# Patient Record
Sex: Female | Born: 1985 | Race: White | Hispanic: No | Marital: Married | Smoking: Former smoker
Health system: Southern US, Community
[De-identification: ages and names within clinical notes are randomized; demographics above are authoritative.]

## PROBLEM LIST (undated history)

## (undated) DIAGNOSIS — M549 Dorsalgia, unspecified: Secondary | ICD-10-CM

## (undated) DIAGNOSIS — D649 Anemia, unspecified: Secondary | ICD-10-CM

## (undated) DIAGNOSIS — G8929 Other chronic pain: Secondary | ICD-10-CM

## (undated) DIAGNOSIS — N390 Urinary tract infection, site not specified: Secondary | ICD-10-CM

## (undated) DIAGNOSIS — R51 Headache: Secondary | ICD-10-CM

## (undated) DIAGNOSIS — L409 Psoriasis, unspecified: Secondary | ICD-10-CM

## (undated) DIAGNOSIS — K219 Gastro-esophageal reflux disease without esophagitis: Secondary | ICD-10-CM

## (undated) HISTORY — PX: COSMETIC SURGERY: SHX468

## (undated) HISTORY — PX: CHOLECYSTECTOMY: SHX55

---

## 2012-12-01 ENCOUNTER — Other Ambulatory Visit: Payer: Self-pay | Admitting: Neurosurgery

## 2012-12-03 ENCOUNTER — Encounter (HOSPITAL_COMMUNITY): Payer: Self-pay

## 2012-12-03 ENCOUNTER — Encounter (HOSPITAL_COMMUNITY): Payer: Self-pay | Admitting: Pharmacist

## 2012-12-03 MED ORDER — CEFAZOLIN SODIUM 10 G IJ SOLR
3.0000 g | INTRAMUSCULAR | Status: AC
  Administered 2012-12-04: 3 g via INTRAVENOUS
  Filled 2012-12-03: qty 3000

## 2012-12-04 ENCOUNTER — Encounter (HOSPITAL_COMMUNITY)
Admission: RE | Disposition: A | Discharge status: Home / Self Care | Payer: Self-pay | Source: Ambulatory Visit | Attending: Neurosurgery

## 2012-12-04 ENCOUNTER — Encounter (HOSPITAL_COMMUNITY): Payer: Self-pay | Admitting: Anesthesiology

## 2012-12-04 ENCOUNTER — Encounter (HOSPITAL_COMMUNITY): Payer: Self-pay | Admitting: *Deleted

## 2012-12-04 ENCOUNTER — Ambulatory Visit (HOSPITAL_COMMUNITY): Payer: BC Managed Care – PPO

## 2012-12-04 ENCOUNTER — Ambulatory Visit (HOSPITAL_COMMUNITY): Payer: BC Managed Care – PPO | Admitting: Anesthesiology

## 2012-12-04 ENCOUNTER — Ambulatory Visit (HOSPITAL_COMMUNITY)
Admission: RE | Admit: 2012-12-04 | Discharge: 2012-12-05 | Disposition: A | Discharge status: Home / Self Care | Payer: BC Managed Care – PPO | Source: Ambulatory Visit | Attending: Neurosurgery | Admitting: Neurosurgery

## 2012-12-04 DIAGNOSIS — M5126 Other intervertebral disc displacement, lumbar region: Secondary | ICD-10-CM | POA: Insufficient documentation

## 2012-12-04 HISTORY — DX: Anemia, unspecified: D64.9

## 2012-12-04 HISTORY — DX: Dorsalgia, unspecified: M54.9

## 2012-12-04 HISTORY — DX: Other chronic pain: G89.29

## 2012-12-04 HISTORY — PX: LUMBAR LAMINECTOMY/DECOMPRESSION MICRODISCECTOMY: SHX5026

## 2012-12-04 HISTORY — DX: Psoriasis, unspecified: L40.9

## 2012-12-04 HISTORY — DX: Gastro-esophageal reflux disease without esophagitis: K21.9

## 2012-12-04 HISTORY — DX: Urinary tract infection, site not specified: N39.0

## 2012-12-04 HISTORY — DX: Headache: R51

## 2012-12-04 LAB — HCG, SERUM, QUALITATIVE: Preg, Serum: NEGATIVE

## 2012-12-04 LAB — CBC
Hemoglobin: 13.1 g/dL (ref 12.0–15.0)
Platelets: 229 10*3/uL (ref 150–400)
RBC: 4.95 MIL/uL (ref 3.87–5.11)
WBC: 7 10*3/uL (ref 4.0–10.5)

## 2012-12-04 LAB — SURGICAL PCR SCREEN: MRSA, PCR: NEGATIVE

## 2012-12-04 SURGERY — LUMBAR LAMINECTOMY/DECOMPRESSION MICRODISCECTOMY 1 LEVEL
Anesthesia: General | Site: Spine Lumbar | Laterality: Bilateral | Wound class: Clean

## 2012-12-04 MED ORDER — MUPIROCIN 2 % EX OINT
TOPICAL_OINTMENT | Freq: Two times a day (BID) | CUTANEOUS | Status: DC
  Administered 2012-12-04: 22:00:00 via NASAL

## 2012-12-04 MED ORDER — LACTATED RINGERS IV SOLN
INTRAVENOUS | Status: DC | PRN
  Administered 2012-12-04 (×2): via INTRAVENOUS

## 2012-12-04 MED ORDER — SODIUM CHLORIDE 0.9 % IJ SOLN
3.0000 mL | Freq: Two times a day (BID) | INTRAMUSCULAR | Status: DC
  Administered 2012-12-04: 3 mL via INTRAVENOUS

## 2012-12-04 MED ORDER — NEOSTIGMINE METHYLSULFATE 1 MG/ML IJ SOLN
INTRAMUSCULAR | Status: DC | PRN
  Administered 2012-12-04: 3 mg via INTRAVENOUS

## 2012-12-04 MED ORDER — PHENOL 1.4 % MT LIQD: 1.0000 | OROMUCOSAL | Status: DC | PRN

## 2012-12-04 MED ORDER — CEFAZOLIN SODIUM 1-5 GM-% IV SOLN
INTRAVENOUS | Status: AC
  Filled 2012-12-04: qty 50

## 2012-12-04 MED ORDER — POTASSIUM CHLORIDE IN NACL 20-0.9 MEQ/L-% IV SOLN
INTRAVENOUS | Status: DC
  Filled 2012-12-04 (×3): qty 1000

## 2012-12-04 MED ORDER — LIDOCAINE-EPINEPHRINE 0.5 %-1:200000 IJ SOLN
INTRAMUSCULAR | Status: DC | PRN
  Administered 2012-12-04: 10 mL

## 2012-12-04 MED ORDER — OXYCODONE HCL 5 MG/5ML PO SOLN: 5.0000 mg | Freq: Once | ORAL | Status: DC | PRN

## 2012-12-04 MED ORDER — MEPERIDINE HCL 25 MG/ML IJ SOLN: 6.2500 mg | INTRAMUSCULAR | Status: DC | PRN

## 2012-12-04 MED ORDER — THROMBIN 5000 UNITS EX KIT
PACK | CUTANEOUS | Status: DC | PRN
  Administered 2012-12-04 (×2): 5000 [IU] via TOPICAL

## 2012-12-04 MED ORDER — 0.9 % SODIUM CHLORIDE (POUR BTL) OPTIME
TOPICAL | Status: DC | PRN
  Administered 2012-12-04: 1000 mL

## 2012-12-04 MED ORDER — HYDROMORPHONE HCL PF 1 MG/ML IJ SOLN
0.2500 mg | INTRAMUSCULAR | Status: DC | PRN
  Administered 2012-12-04 (×2): 0.5 mg via INTRAVENOUS

## 2012-12-04 MED ORDER — MIDAZOLAM HCL 5 MG/5ML IJ SOLN
INTRAMUSCULAR | Status: DC | PRN
  Administered 2012-12-04: 2 mg via INTRAVENOUS

## 2012-12-04 MED ORDER — CEFAZOLIN SODIUM-DEXTROSE 2-3 GM-% IV SOLR
INTRAVENOUS | Status: AC
  Filled 2012-12-04: qty 50

## 2012-12-04 MED ORDER — DEXAMETHASONE SODIUM PHOSPHATE 10 MG/ML IJ SOLN
INTRAMUSCULAR | Status: DC | PRN
  Administered 2012-12-04: 8 mg via INTRAVENOUS

## 2012-12-04 MED ORDER — FENTANYL CITRATE 0.05 MG/ML IJ SOLN
INTRAMUSCULAR | Status: DC | PRN
  Administered 2012-12-04: 100 ug via INTRAVENOUS
  Administered 2012-12-04 (×4): 50 ug via INTRAVENOUS

## 2012-12-04 MED ORDER — ZOLPIDEM TARTRATE 5 MG PO TABS: 5.0000 mg | ORAL_TABLET | Freq: Every evening | ORAL | Status: DC | PRN

## 2012-12-04 MED ORDER — ONDANSETRON HCL 4 MG/2ML IJ SOLN
INTRAMUSCULAR | Status: AC
  Filled 2012-12-04: qty 2

## 2012-12-04 MED ORDER — MORPHINE SULFATE 2 MG/ML IJ SOLN
1.0000 mg | INTRAMUSCULAR | Status: DC | PRN
  Administered 2012-12-04: 2 mg via INTRAVENOUS
  Filled 2012-12-04: qty 1

## 2012-12-04 MED ORDER — ONDANSETRON HCL 4 MG/2ML IJ SOLN
INTRAMUSCULAR | Status: DC | PRN
  Administered 2012-12-04: 4 mg via INTRAVENOUS

## 2012-12-04 MED ORDER — OXYCODONE HCL 5 MG PO TABS: 5.0000 mg | ORAL_TABLET | ORAL | Status: DC | PRN

## 2012-12-04 MED ORDER — MUPIROCIN 2 % EX OINT
TOPICAL_OINTMENT | Freq: Once | CUTANEOUS | Status: AC
  Administered 2012-12-04: 07:00:00 via NASAL
  Filled 2012-12-04: qty 22

## 2012-12-04 MED ORDER — GLYCOPYRROLATE 0.2 MG/ML IJ SOLN
INTRAMUSCULAR | Status: DC | PRN
  Administered 2012-12-04: 0.4 mg via INTRAVENOUS

## 2012-12-04 MED ORDER — KETOROLAC TROMETHAMINE 30 MG/ML IJ SOLN
30.0000 mg | Freq: Four times a day (QID) | INTRAMUSCULAR | Status: DC
  Administered 2012-12-04 – 2012-12-05 (×3): 30 mg via INTRAVENOUS
  Filled 2012-12-04 (×7): qty 1

## 2012-12-04 MED ORDER — SENNA 8.6 MG PO TABS
1.0000 | ORAL_TABLET | Freq: Two times a day (BID) | ORAL | Status: DC
  Administered 2012-12-04: 8.6 mg via ORAL
  Filled 2012-12-04 (×3): qty 1

## 2012-12-04 MED ORDER — SODIUM CHLORIDE 0.9 % IJ SOLN: 3.0000 mL | INTRAMUSCULAR | Status: DC | PRN

## 2012-12-04 MED ORDER — ACETAMINOPHEN 10 MG/ML IV SOLN
1000.0000 mg | Freq: Four times a day (QID) | INTRAVENOUS | Status: DC
  Administered 2012-12-04 – 2012-12-05 (×3): 1000 mg via INTRAVENOUS
  Filled 2012-12-04 (×3): qty 100

## 2012-12-04 MED ORDER — ONDANSETRON HCL 4 MG/2ML IJ SOLN: 4.0000 mg | INTRAMUSCULAR | Status: DC | PRN

## 2012-12-04 MED ORDER — PROPOFOL 10 MG/ML IV BOLUS
INTRAVENOUS | Status: DC | PRN
  Administered 2012-12-04: 120 mg via INTRAVENOUS

## 2012-12-04 MED ORDER — ROCURONIUM BROMIDE 100 MG/10ML IV SOLN
INTRAVENOUS | Status: DC | PRN
  Administered 2012-12-04: 50 mg via INTRAVENOUS
  Administered 2012-12-04: 15 mg via INTRAVENOUS

## 2012-12-04 MED ORDER — LIDOCAINE HCL (CARDIAC) 20 MG/ML IV SOLN
INTRAVENOUS | Status: DC | PRN
  Administered 2012-12-04: 100 mg via INTRAVENOUS

## 2012-12-04 MED ORDER — HEMOSTATIC AGENTS (NO CHARGE) OPTIME
TOPICAL | Status: DC | PRN
  Administered 2012-12-04: 1 via TOPICAL

## 2012-12-04 MED ORDER — MUPIROCIN 2 % EX OINT
TOPICAL_OINTMENT | CUTANEOUS | Status: AC
  Filled 2012-12-04: qty 22

## 2012-12-04 MED ORDER — LORATADINE 10 MG PO TABS
10.0000 mg | ORAL_TABLET | Freq: Every day | ORAL | Status: DC
  Administered 2012-12-04: 10 mg via ORAL
  Filled 2012-12-04 (×2): qty 1

## 2012-12-04 MED ORDER — HYDROMORPHONE HCL PF 1 MG/ML IJ SOLN
INTRAMUSCULAR | Status: AC
  Filled 2012-12-04: qty 1

## 2012-12-04 MED ORDER — HYDROCODONE-ACETAMINOPHEN 5-325 MG PO TABS: 1.0000 | ORAL_TABLET | ORAL | Status: DC | PRN

## 2012-12-04 MED ORDER — OXYCODONE HCL 5 MG PO TABS: 5.0000 mg | ORAL_TABLET | Freq: Once | ORAL | Status: DC | PRN

## 2012-12-04 MED ORDER — SENNOSIDES-DOCUSATE SODIUM 8.6-50 MG PO TABS
1.0000 | ORAL_TABLET | Freq: Every evening | ORAL | Status: DC | PRN
  Administered 2012-12-04: 1 via ORAL
  Filled 2012-12-04: qty 1

## 2012-12-04 MED ORDER — ONDANSETRON HCL 4 MG/2ML IJ SOLN
4.0000 mg | Freq: Once | INTRAMUSCULAR | Status: AC | PRN
  Administered 2012-12-04: 4 mg via INTRAVENOUS

## 2012-12-04 MED ORDER — MENTHOL 3 MG MT LOZG: 1.0000 | LOZENGE | OROMUCOSAL | Status: DC | PRN

## 2012-12-04 MED ORDER — CELECOXIB 200 MG PO CAPS: 200.0000 mg | ORAL_CAPSULE | Freq: Every evening | ORAL | Status: DC

## 2012-12-04 SURGICAL SUPPLY — 54 items
ADH SKN CLS APL DERMABOND .7 (GAUZE/BANDAGES/DRESSINGS)
BAG DECANTER FOR FLEXI CONT (MISCELLANEOUS) IMPLANT
BENZOIN TINCTURE PRP APPL 2/3 (GAUZE/BANDAGES/DRESSINGS) IMPLANT
BLADE SURG ROTATE 9660 (MISCELLANEOUS) IMPLANT
BUR MATCHSTICK NEURO 3.0 LAGG (BURR) ×2 IMPLANT
CANISTER SUCTION 2500CC (MISCELLANEOUS) ×2 IMPLANT
CLOTH BEACON ORANGE TIMEOUT ST (SAFETY) ×2 IMPLANT
CONT SPEC 4OZ CLIKSEAL STRL BL (MISCELLANEOUS) ×2 IMPLANT
DECANTER SPIKE VIAL GLASS SM (MISCELLANEOUS) ×2 IMPLANT
DERMABOND ADHESIVE PROPEN (GAUZE/BANDAGES/DRESSINGS) ×1
DERMABOND ADVANCED (GAUZE/BANDAGES/DRESSINGS)
DERMABOND ADVANCED .7 DNX12 (GAUZE/BANDAGES/DRESSINGS) IMPLANT
DERMABOND ADVANCED .7 DNX6 (GAUZE/BANDAGES/DRESSINGS) ×1 IMPLANT
DRAPE LAPAROTOMY 100X72X124 (DRAPES) ×2 IMPLANT
DRAPE MICROSCOPE LEICA (MISCELLANEOUS) ×2 IMPLANT
DRAPE POUCH INSTRU U-SHP 10X18 (DRAPES) ×2 IMPLANT
DRAPE SURG 17X23 STRL (DRAPES) ×2 IMPLANT
DURAPREP 26ML APPLICATOR (WOUND CARE) IMPLANT
ELECT BLADE 6.5 EXT (BLADE) ×2 IMPLANT
ELECT REM PT RETURN 9FT ADLT (ELECTROSURGICAL) ×2
ELECTRODE REM PT RTRN 9FT ADLT (ELECTROSURGICAL) ×1 IMPLANT
GAUZE SPONGE 4X4 16PLY XRAY LF (GAUZE/BANDAGES/DRESSINGS) IMPLANT
GLOVE BIOGEL PI IND STRL 8.5 (GLOVE) ×2 IMPLANT
GLOVE BIOGEL PI INDICATOR 8.5 (GLOVE) ×2
GLOVE ECLIPSE 6.5 STRL STRAW (GLOVE) ×2 IMPLANT
GLOVE ECLIPSE 7.5 STRL STRAW (GLOVE) ×2 IMPLANT
GLOVE EXAM NITRILE LRG STRL (GLOVE) IMPLANT
GLOVE EXAM NITRILE MD LF STRL (GLOVE) IMPLANT
GLOVE EXAM NITRILE XL STR (GLOVE) IMPLANT
GLOVE EXAM NITRILE XS STR PU (GLOVE) IMPLANT
GLOVE SURG SS PI 8.0 STRL IVOR (GLOVE) ×6 IMPLANT
GOWN BRE IMP SLV AUR LG STRL (GOWN DISPOSABLE) ×4 IMPLANT
GOWN BRE IMP SLV AUR XL STRL (GOWN DISPOSABLE) IMPLANT
GOWN STRL REIN 2XL LVL4 (GOWN DISPOSABLE) ×4 IMPLANT
KIT BASIN OR (CUSTOM PROCEDURE TRAY) ×2 IMPLANT
KIT ROOM TURNOVER OR (KITS) ×2 IMPLANT
NEEDLE HYPO 25X1 1.5 SAFETY (NEEDLE) ×2 IMPLANT
NEEDLE SPNL 18GX3.5 QUINCKE PK (NEEDLE) ×2 IMPLANT
NS IRRIG 1000ML POUR BTL (IV SOLUTION) ×2 IMPLANT
PACK LAMINECTOMY NEURO (CUSTOM PROCEDURE TRAY) ×2 IMPLANT
PAD ARMBOARD 7.5X6 YLW CONV (MISCELLANEOUS) ×10 IMPLANT
RUBBERBAND STERILE (MISCELLANEOUS) ×4 IMPLANT
SPONGE GAUZE 4X4 12PLY (GAUZE/BANDAGES/DRESSINGS) IMPLANT
SPONGE LAP 4X18 X RAY DECT (DISPOSABLE) IMPLANT
SPONGE SURGIFOAM ABS GEL SZ50 (HEMOSTASIS) ×2 IMPLANT
STRIP CLOSURE SKIN 1/2X4 (GAUZE/BANDAGES/DRESSINGS) IMPLANT
SUT VIC AB 0 CT1 18XCR BRD8 (SUTURE) ×1 IMPLANT
SUT VIC AB 0 CT1 8-18 (SUTURE) ×1
SUT VIC AB 2-0 CT1 18 (SUTURE) ×2 IMPLANT
SUT VIC AB 3-0 SH 8-18 (SUTURE) ×2 IMPLANT
SYR 20ML ECCENTRIC (SYRINGE) ×2 IMPLANT
TOWEL OR 17X24 6PK STRL BLUE (TOWEL DISPOSABLE) ×2 IMPLANT
TOWEL OR 17X26 10 PK STRL BLUE (TOWEL DISPOSABLE) ×2 IMPLANT
WATER STERILE IRR 1000ML POUR (IV SOLUTION) ×2 IMPLANT

## 2012-12-04 NOTE — Anesthesia Preprocedure Evaluation (Addendum)
Anesthesia Evaluation  Patient identified by MRN, date of birth, ID band Patient awake    Reviewed: Allergy & Precautions, H&P , NPO status , Patient's Chart, lab work & pertinent test results, reviewed documented beta blocker date and time   History of Anesthesia Complications Negative for: history of anesthetic complications  Airway Mallampati: II TM Distance: >3 FB Neck ROM: Full    Dental  (+) Poor Dentition and Dental Advisory Given   Pulmonary Current Smoker,          Cardiovascular negative cardio ROS      Neuro/Psych  Headaches,    GI/Hepatic Neg liver ROS, GERD-  Medicated and Controlled,  Endo/Other  negative endocrine ROS  Renal/GU negative Renal ROS     Musculoskeletal negative musculoskeletal ROS (+)   Abdominal   Peds  Hematology negative hematology ROS (+)   Anesthesia Other Findings Lower teeth broken  Reproductive/Obstetrics negative OB ROS                          Anesthesia Physical Anesthesia Plan  ASA: III  Anesthesia Plan: General   Post-op Pain Management:    Induction: Intravenous  Airway Management Planned: Oral ETT  Additional Equipment:   Intra-op Plan:   Post-operative Plan: Extubation in OR  Informed Consent: I have reviewed the patients History and Physical, chart, labs and discussed the procedure including the risks, benefits and alternatives for the proposed anesthesia with the patient or authorized representative who has indicated his/her understanding and acceptance.     Plan Discussed with: CRNA and Surgeon  Anesthesia Plan Comments:         Anesthesia Quick Evaluation

## 2012-12-04 NOTE — Op Note (Signed)
12/04/2012  12:44 PM  PATIENT:  Colleen Navarro  27 y.o. 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 was brought to the operating room intubated and placed under a general anesthetic. She was positioned on a Wilson frame with all pressure points padded. Her back was prepped and draped in a sterile manner. I infiltrated 10cc into the planned incision. I opened the skin with a 10 blade and took the incision down to the thoracolumbar fascia. I exposed the lamina of L4 and L5 bilaterally. I confirmed my location with intraoperative xray. I performed a semilaminectomy of L4 and L5. I exposed the thecal sac. The microscope was brought into the operative field. With microdissection Dr. Phoebe Perch and I found very large fragments of disc material both rostral and caudal to the disc space. Endplate, was also herniated out of the disc space.  I did have to open the disc space. Using microdissection we again found large degenerated fragments of disc in the space. I did not open the disc space on the left. We were able to decompress the canal, and the L5 roots bilaterally. We removed all the loose fragements that we could appreciate. I irrigated. We then closed the wound in layers. We approximated the thoracolumbar fascia, subcutaneous, and subcuticular  Tissue with vicryl sutures. I used dermabond for a sterile dressing.   PLAN OF CARE: Admit for  overnight observation  PATIENT DISPOSITION:  PACU - hemodynamically stable.   Delay start of Pharmacological VTE agent (>24hrs) due to surgical blood loss or risk of bleeding:  yes

## 2012-12-04 NOTE — H&P (Signed)
  BP 114/83  Pulse 103  Temp(Src) 98.1 F (36.7 C) (Oral)  Resp 20  Ht 5\' 6"  (1.676 m)  Wt 123.832 kg (273 lb)  BMI 44.08 kg/m2  SpO2 100%  LMP 10/04/2012 Colleen Navarro is a 27 y.o. woman with lower extremity pain which has gotten worse since this fall. Repeat mri shows an enlarged disc herniation at L4/5 Allergies  Allergen Reactions  . Iodine Hives  . Neosporin (Neomycin-Bacitracin Zn-Polymyx) Rash    No trouble breating   Past Medical History  Diagnosis Date  . Urinary tract infection     hx of  . GERD (gastroesophageal reflux disease)   . Headache     occasional and hx of migraines  . Anemia     hx of  . Chronic back pain     low back  . Psoriasis    Past Surgical History  Procedure Laterality Date  . Cosmetic surgery      right cheek (face)  . Cholecystectomy     Prior to Admission medications   Medication Sig Start Date End Date Taking? Authorizing Provider  celecoxib (CELEBREX) 200 MG capsule Take 200 mg by mouth every evening.   Yes Historical Provider, MD  cetirizine (ZYRTEC) 10 MG tablet Take 10 mg by mouth every evening.   Yes Historical Provider, MD  cyclobenzaprine (FLEXERIL) 10 MG tablet Take 10 mg by mouth at bedtime.   Yes Historical Provider, MD   History reviewed. No pertinent family history. History   Social History  . Marital Status: Single    Spouse Name: N/A    Number of Children: N/A  . Years of Education: N/A   Occupational History  . Not on file.   Social History Main Topics  . Smoking status: Current Every Day Smoker -- 0.50 packs/day for 10 years    Types: Cigarettes  . Smokeless tobacco: Not on file  . Alcohol Use: Yes     Comment: "social"  . Drug Use: No  . Sexually Active: Not on file   Other Topics Concern  . Not on file   Social History Narrative  . No narrative on file   BP 114/83  Pulse 103  Temp(Src) 98.1 F (36.7 C) (Oral)  Resp 20  Ht 5\' 6"  (1.676 m)  Wt 123.832 kg (273 lb)  BMI 44.08 kg/m2  SpO2 100%   LMP 10/04/2012 Colleen Navarro has decided to undergo a lumbar discetomy/decompression for a herniated disc at levels L4/5. Risks and benefits including but not limited to bleeding, infection, paralysis, weakness in one or both extremities, bowel and/or bladder dysfunction, need for further surgery, no relief of pain. Colleen Navarro understands and wishes to proceed.  Alert and oriented x 4, speech is clear and fluent Symmetric facies, tongue and uvula midline 5/5 strength in upper extremities Mild weakness in lower extremities Reflexes intact throughout

## 2012-12-04 NOTE — Transfer of Care (Signed)
Immediate Anesthesia Transfer of Care Note  Patient: Colleen Navarro  Procedure(s) Performed: Procedure(s) with comments: LUMBAR LAMINECTOMY/DECOMPRESSION MICRODISCECTOMY 1 LEVEL (Bilateral) - Bilateral Lumbar four-five  diskectomy  Patient Location: PACU  Anesthesia Type:General  Level of Consciousness: awake, alert  and oriented  Airway & Oxygen Therapy: Patient Spontanous Breathing and Patient connected to nasal cannula oxygen  Post-op Assessment: Report given to PACU RN and Post -op Vital signs reviewed and stable  Post vital signs: Reviewed and stable  Complications: No apparent anesthesia complications

## 2012-12-04 NOTE — Preoperative (Signed)
Beta Blockers   Reason not to administer Beta Blockers:Not Applicable 

## 2012-12-04 NOTE — Anesthesia Postprocedure Evaluation (Signed)
Anesthesia Post Note  Patient: Colleen Navarro  Procedure(s) Performed: Procedure(s) (LRB): LUMBAR LAMINECTOMY/DECOMPRESSION MICRODISCECTOMY 1 LEVEL (Bilateral)  Anesthesia type: general  Patient location: PACU  Post pain: Pain level controlled  Post assessment: Patient's Cardiovascular Status Stable  Last Vitals:  Filed Vitals:   12/04/12 1400  BP:   Pulse: 78  Temp: 36.7 C  Resp: 18    Post vital signs: Reviewed and stable  Level of consciousness: sedated  Complications: No apparent anesthesia complications

## 2012-12-04 NOTE — Plan of Care (Signed)
Problem: Consults Goal: Diagnosis - Spinal Surgery Outcome: Completed/Met Date Met:  12/04/12 Microdiscectomy

## 2012-12-05 MED ORDER — CYCLOBENZAPRINE HCL 10 MG PO TABS: 10.0000 mg | ORAL_TABLET | Freq: Every day | ORAL | Status: DC

## 2012-12-05 MED ORDER — HYDROCODONE-ACETAMINOPHEN 5-325 MG PO TABS: 1.0000 | ORAL_TABLET | ORAL | Status: DC | PRN

## 2012-12-05 NOTE — Discharge Summary (Signed)
Physician Discharge Summary  Patient ID: [name] MRN: [id number] DOB/AGE: 02-10-1986 26 y.o.  Admit date: 12/04/2012 Discharge date: 12/05/2012  Admission Diagnoses:lumbar herniated disc L4/5   Discharge Diagnoses: lumbar herniated disc L4/5  Active Problems:   * No active hospital problems. *   Discharged Condition: good  Hospital Course: pt admitted on day of surgery - underwent procedure below - pt doing well no leg pain, ambulating, voiding  Consults: None  Significant Diagnostic Studies: none  Treatments: surgery: Bilateral  LUMBAR LAMINECTOMY/DECOMPRESSION MICRODISCECTOMY 1 LEVEL L4/5  Microdissection   Discharge Exam: Blood pressure 111/57, pulse 90, temperature 99.1 F (37.3 C), temperature source Oral, resp. rate 16, height 5\' 6"  (1.676 m), weight 123.832 kg (273 lb), last menstrual period 10/04/2012, SpO2 96.00%. Wound:c/d/i  Disposition: home     Medication List    TAKE these medications       celecoxib 200 MG capsule  Commonly known as:  CELEBREX  Take 200 mg by mouth every evening.     cetirizine 10 MG tablet  Commonly known as:  ZYRTEC  Take 10 mg by mouth every evening.     cyclobenzaprine 10 MG tablet  Commonly known as:  FLEXERIL  Take 1 tablet (10 mg total) by mouth at bedtime.     HYDROcodone-acetaminophen 5-325 MG per tablet  Commonly known as:  NORCO/VICODIN  Take 1-2 tablets by mouth every 4 (four) hours as needed.         Signed: Clydene Fake, MD 12/05/2012, 7:23 AM

## 2012-12-07 NOTE — Addendum Note (Signed)
Addendum created 12/07/12 1035 by Edmonia Caprio, CRNA   Modules edited: Anesthesia Events

## 2012-12-09 ENCOUNTER — Encounter (HOSPITAL_COMMUNITY): Payer: Self-pay | Admitting: Neurosurgery

## 2014-07-28 IMAGING — CR DG LUMBAR SPINE 2-3V
1 series · 1 of 1 positions shown · non-contrast
Comparison: MRI 11/27/2012

CLINICAL DATA: Lumbar laminectomy/microdiskectomy L4-5.

LUMBAR SPINE - 2-3 VIEW

[view not recorded]
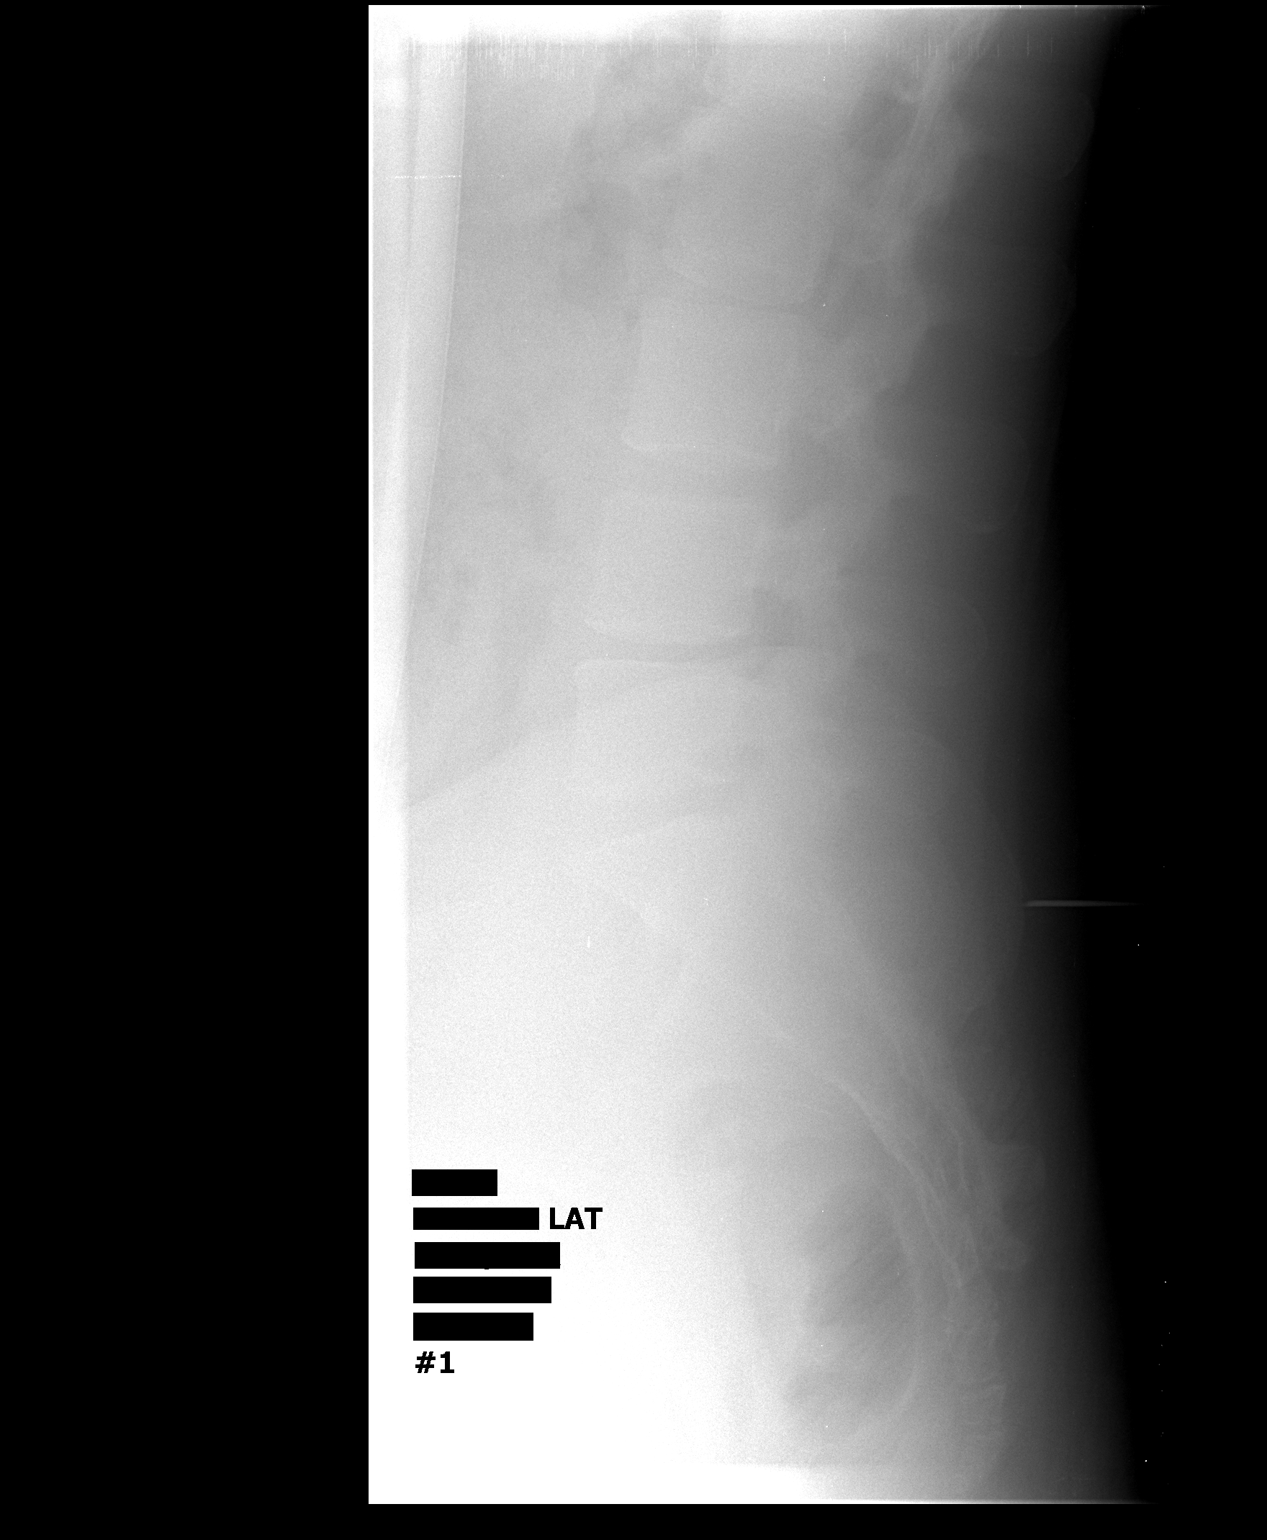

[1 of 1 positions shown; findings below may reference images not displayed]

FINDINGS: Examination demonstrates mild spondylosis with disc space
narrowing at the L4-5 level.  A metallic surgical instrument is
present over the posterior soft tissues at the L5-S1 level.
Recommend correlation with findings at the time of the procedure.

## 2014-07-28 IMAGING — CR DG LUMBAR SPINE 2-3V
1 series · 1 of 1 positions shown · non-contrast
Comparison: MRI 11/27/2012

CLINICAL DATA: Lumbar laminectomy/microdiskectomy L4-5.

LUMBAR SPINE - 2-3 VIEW

[view not recorded]
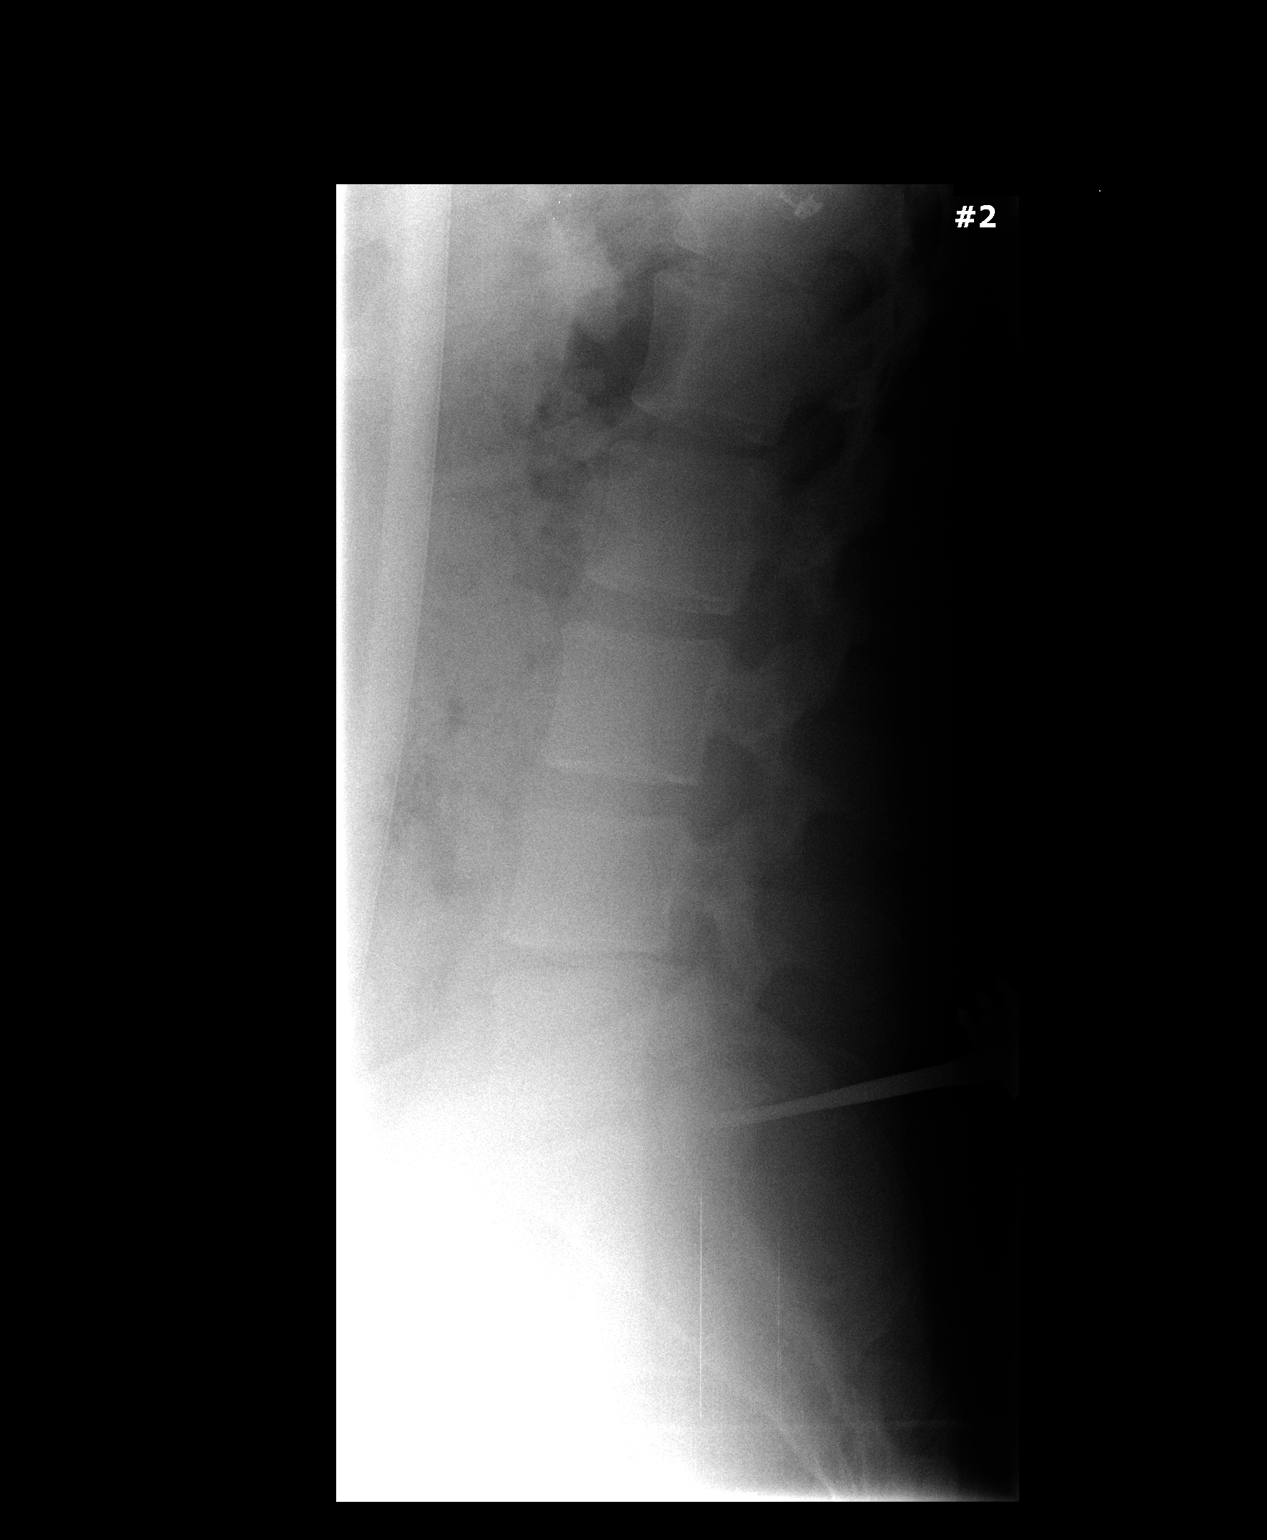

[1 of 1 positions shown; findings below may reference images not displayed]

FINDINGS: Examination demonstrates mild spondylosis with disc space
narrowing at the L4-5 level.  A metallic surgical instrument is
present over the posterior soft tissues at the L5-S1 level.
Recommend correlation with findings at the time of the procedure.

## 2019-07-06 DIAGNOSIS — L409 Psoriasis, unspecified: Secondary | ICD-10-CM | POA: Insufficient documentation

## 2022-05-14 DIAGNOSIS — O0993 Supervision of high risk pregnancy, unspecified, third trimester: Principal | ICD-10-CM | POA: Insufficient documentation

## 2022-05-30 ENCOUNTER — Other Ambulatory Visit: Payer: Self-pay | Admitting: Certified Nurse Midwife

## 2022-05-30 DIAGNOSIS — Z3689 Encounter for other specified antenatal screening: Secondary | ICD-10-CM

## 2022-06-03 DIAGNOSIS — O0930 Supervision of pregnancy with insufficient antenatal care, unspecified trimester: Secondary | ICD-10-CM | POA: Insufficient documentation

## 2022-06-03 DIAGNOSIS — O99213 Obesity complicating pregnancy, third trimester: Secondary | ICD-10-CM | POA: Insufficient documentation

## 2022-06-03 DIAGNOSIS — O09523 Supervision of elderly multigravida, third trimester: Secondary | ICD-10-CM | POA: Diagnosis present

## 2022-06-03 DIAGNOSIS — Z98891 History of uterine scar from previous surgery: Secondary | ICD-10-CM

## 2022-06-20 ENCOUNTER — Other Ambulatory Visit: Payer: Self-pay

## 2022-06-26 ENCOUNTER — Inpatient Hospital Stay: Admission: RE | Admit: 2022-06-26 | Payer: Self-pay | Source: Ambulatory Visit

## 2022-07-01 ENCOUNTER — Encounter
Admission: RE | Admit: 2022-07-01 | Discharge: 2022-07-01 | Disposition: A | Discharge status: Home / Self Care | Payer: Medicaid Other | Source: Ambulatory Visit | Attending: Anesthesiology | Admitting: Anesthesiology

## 2022-07-01 NOTE — Consult Note (Signed)
[facility] Anesthesia Consultation  Colleen Navarro FAO:[id number] DOB: [birth year] DOA: 07/01/2022 PCP: Oneita Hurt, No   Requesting physician: unknown Date of consultation: 07/01/22 Reason for consultation: History of lumbar discectomy  CHIEF COMPLAINT:  History of lumbar discectomy  HISTORY OF PRESENT ILLNESS: Colleen Navarro  is a 36 y.o. female with a known history of former smoking (quit 05/2021), current vaping, GERD, delayed emergence, PONV, and s/p L4-5 discectomy presenting for preanesthesia consult.  She is a G2P1 at 33 weeks with hx prior c-section under GETA for fetal intolerance of labor.  Plan is for TOLAC.  PAST MEDICAL HISTORY:   Past Medical History:  Diagnosis Date   Anemia    hx of   Chronic back pain    low back   GERD (gastroesophageal reflux disease)    Headache(784.0)    occasional and hx of migraines   Psoriasis    Urinary tract infection    hx of    PAST SURGICAL HISTORY:  Past Surgical History:  Procedure Laterality Date   CHOLECYSTECTOMY     COSMETIC SURGERY     right cheek (face)   LUMBAR LAMINECTOMY/DECOMPRESSION MICRODISCECTOMY Bilateral 12/04/2012   Procedure: LUMBAR LAMINECTOMY/DECOMPRESSION MICRODISCECTOMY 1 LEVEL;  Surgeon: Carmela Hurt, MD;  Location: [facility];  Service: Neurosurgery;  Laterality: Bilateral;  Bilateral Lumbar four-five  diskectomy    SOCIAL HISTORY:  Social History   Tobacco Use   Smoking status: Every Day    Packs/day: 0.50    Years: 10.00    Total pack years: 5.00    Types: Cigarettes   Smokeless tobacco: Not on file  Substance Use Topics   Alcohol use: Yes    Comment: "social"    FAMILY HISTORY: No family history on file.  DRUG ALLERGIES:  Allergies  Allergen Reactions   Iodine Hives   Neosporin [Neomycin-Bacitracin Zn-Polymyx] Rash    No trouble breating    REVIEW OF SYSTEMS:   RESPIRATORY: No cough, shortness of breath, wheezing.  CARDIOVASCULAR: No chest pain,  orthopnea, edema.  HEMATOLOGY: No anemia, easy bruising or bleeding SKIN: No rash or lesion. NEUROLOGIC: No tingling, numbness, weakness.  PSYCHIATRY: No anxiety or depression.   MEDICATIONS AT HOME:  Prior to Admission medications   Medication Sig Start Date End Date Taking? Authorizing Provider  celecoxib (CELEBREX) 200 MG capsule Take 200 mg by mouth every evening.    [provider]  cetirizine (ZYRTEC) 10 MG tablet Take 10 mg by mouth every evening.    [provider]  cyclobenzaprine (FLEXERIL) 10 MG tablet Take 1 tablet (10 mg total) by mouth at bedtime. 12/05/12   Colon Branch, MD  HYDROcodone-acetaminophen (NORCO/VICODIN) 5-325 MG per tablet Take 1-2 tablets by mouth every 4 (four) hours as needed. 12/05/12   Colon Branch, MD      PHYSICAL EXAMINATION:   VITAL SIGNS: There were no vitals taken for this visit.  GENERAL:  36 y.o.-year-old patient no acute distress.  HEENT: Head atraumatic, normocephalic. Oropharynx and nasopharynx clear. MP I, TM distance >3 cm, normal mouth opening; upper partial, upper chipped tooth, lower missing teeth x4. LUNGS: No use of accessory muscles of respiration.   EXTREMITIES: No pedal edema, cyanosis, or clubbing.  NEUROLOGIC: normal gait PSYCHIATRIC: The patient is alert and oriented x 3.  SKIN: No obvious rash, lesion, or ulcer.    IMPRESSION AND PLAN:   Colleen Navarro  is a 36 y.o. female presenting with former smoking (quit 05/2021), current vaping, GERD, delayed emergence, PONV,  and s/p L4-5 discectomy presenting for preanesthesia consult.  She is a G2P1 at 33 weeks with hx prior c-section under GETA for fetal intolerance of labor.  Plan is for TOLAC.   She is concerned about spinal functionality given her history of lumbar discectomy.  We discussed analgesic options during labor including epidural analgesia. She would like to try for a natural birth, but we discussed the risks and benefits of epidural anesthesia.  We also  discussed spinal for c-section, and I told her that a spinal would have a high likelihood of success since she has no hardware.  If the spinal fails, however, we discussed backup GETA.  She understands all above.  All questions answered and concerns addressed.

## 2022-07-04 ENCOUNTER — Ambulatory Visit: Payer: Medicaid Other

## 2022-07-18 ENCOUNTER — Ambulatory Visit: Payer: Medicaid Other | Attending: Certified Nurse Midwife

## 2022-07-18 ENCOUNTER — Other Ambulatory Visit: Payer: Self-pay

## 2022-07-18 ENCOUNTER — Ambulatory Visit (HOSPITAL_BASED_OUTPATIENT_CLINIC_OR_DEPARTMENT_OTHER): Payer: Medicaid Other | Admitting: Obstetrics and Gynecology

## 2022-07-18 DIAGNOSIS — Z3689 Encounter for other specified antenatal screening: Secondary | ICD-10-CM

## 2022-07-18 DIAGNOSIS — Z3A35 35 weeks gestation of pregnancy: Secondary | ICD-10-CM | POA: Insufficient documentation

## 2022-07-18 DIAGNOSIS — E669 Obesity, unspecified: Secondary | ICD-10-CM

## 2022-07-18 DIAGNOSIS — O368930 Maternal care for other specified fetal problems, third trimester, not applicable or unspecified: Secondary | ICD-10-CM | POA: Diagnosis not present

## 2022-07-18 DIAGNOSIS — O99213 Obesity complicating pregnancy, third trimester: Secondary | ICD-10-CM | POA: Diagnosis not present

## 2022-07-18 DIAGNOSIS — O09891 Supervision of other high risk pregnancies, first trimester: Secondary | ICD-10-CM | POA: Diagnosis not present

## 2022-07-18 DIAGNOSIS — O99713 Diseases of the skin and subcutaneous tissue complicating pregnancy, third trimester: Secondary | ICD-10-CM | POA: Diagnosis not present

## 2022-07-18 DIAGNOSIS — O283 Abnormal ultrasonic finding on antenatal screening of mother: Secondary | ICD-10-CM | POA: Insufficient documentation

## 2022-07-18 DIAGNOSIS — O358XX0 Maternal care for other (suspected) fetal abnormality and damage, not applicable or unspecified: Secondary | ICD-10-CM | POA: Diagnosis not present

## 2022-07-18 DIAGNOSIS — O09523 Supervision of elderly multigravida, third trimester: Secondary | ICD-10-CM | POA: Insufficient documentation

## 2022-07-18 DIAGNOSIS — L4050 Arthropathic psoriasis, unspecified: Secondary | ICD-10-CM | POA: Diagnosis not present

## 2022-07-18 DIAGNOSIS — O368990 Maternal care for other specified fetal problems, unspecified trimester, not applicable or unspecified: Secondary | ICD-10-CM

## 2022-07-18 DIAGNOSIS — O34219 Maternal care for unspecified type scar from previous cesarean delivery: Secondary | ICD-10-CM

## 2022-07-18 DIAGNOSIS — M2550 Pain in unspecified joint: Secondary | ICD-10-CM | POA: Insufficient documentation

## 2022-07-18 NOTE — Progress Notes (Signed)
Maternal-Fetal Medicine   Name: Colleen Navarro  DOB: [birth year] MRN: [id number] Referring Provider: Lucrezia Europe, CNM  I had the pleasure of seeing [name] today at [facility].  She is G2 P1 at 35w 4d gestation and is here for ultrasound evaluation and consultation.   Patient has psoriatic arthritis, and she took ixekizumab Donnetta Hail) injections till 23 weeks' gestation. She had discontinued the medication on the advice of her dermatologist. She was offered to take cimizia (certolizumab). Patient has significant recurrence of painful joint symptoms.  Past surgical history is significant for lumbar laminectomy /decompression microdiscectomy in 2014. Obstetrical history significant for a term cesarean delivery in 2014 for nonreassuring fetal heart rates. Patient does not have gestational diabetes.  On cell-free fetal DNA screening, the risks of fetal aneuploidies are not increased.  Fetal sex was reported as female. She had consultation with our anesthesiologist.  Ultrasound The estimated fetal weight is at the 97th percentile and the abdominal circumference measurement is at the 99th percentile.  Cephalic presentation.  Amniotic fluid is normal and good fetal activity seen.  Fetal anatomical survey is very limited because of advanced gestational age and maternal body habitus.  Scrotum and well descended testes are seen.  The penis could not be clearly seen raising the possibility of ambiguous genitalia.  As maternal obesity limits resolution of images, failure to detect anomalies are more common . R/o ambiguous genitalia Cell-free fetal DNA screening, the fetal sex was reported as female.  Nonvisualization of penis could be from hypospadias or micropenis.  It can be associated with genetic syndromes. Rarely, it could be positional.  Counseled the couple on the finding and that only neonatal examination will confirm or rule out ambiguous genitalia.  Management will be based on the  findings.   Ixekizumab (Taltz) Exposure in Pregnancy This drug crosses the placenta.  As reports a scanty about 6 use in pregnancy, I informed her that we we do not have adequate literature to counsel women not need to use in pregnancy.  I encouraged the patient to enroll her herself in registry [id number]).   Patient understands the limitations of ultrasound in detecting fetal anomalies. My literature search did not show any clear relationship of taltz with ambiguous genitalia.  Certolizumab pegol (cimzia) Only limited information is available on its use in pregnancy. Since this antibody is human specific, animal studies are unlikely to give information. Its action is like other immunomodulators with TNF? activity. The 2016 European League against Rheumatism (EULAR) guidelines supported use in pregnancy because of low placental passage ([employer] Drugs in Pregnancy and Lactation, 12th Edition).  I counseled the patient that limited information is available on this drug in pregnancy. However, the risk of congenital malformations does not seem to be increased. As with TNF? inhibitors, if the drug is given beyond 30 weeks' gestation, neonatal immunization may have to be delayed till 33 months of age.  I informed the patient that Enbrel and humira can be taken in pregnancy. Live vaccination may have to be delayed.  Pregnancy and childbirth after lumbar disc surgery Patient had discectomy about 9 years ago.  I reassured her that vaginal birth can be attempted safely and cesarean section should be performed only for obstetric indications.  Vaginal birth after cesarean delivery (VBAC) Patient had cesarean delivery because of nonreassuring fetal heart trace and not for failure to progress in labor.  She is keen on VBAC.  Based on maternal-fetal medicine units network scoring system that incorporates maternal age, height,  weight and previous obstetric history, she has a 45% chance of a successful vaginal  birth.  I discussed the benefits and risks of VBAC.  The likelihood of venous thromboembolism episodes and infection is less with VBAC.  Risk of uterine scar rupture is about 1%.  Induction of labor only slightly increases the risk of rupture to probably about 2% to 3%.  Recommendations -If patient desires VBAC, fetal growth assessment may be performed before delivery. -Patient will discuss with her dermatologist about starting Cimzia.  She is aware that live vaccination may have to be delayed. -Neonatologist to be informed at delivery about the possibility of ambiguous genitalia.  Thank you for consultation.  If you have any questions or concerns, please contact me the [facility].  Consultation including face-to-face counseling (more than 50% of time spent) is 45 minutes.

## 2022-08-20 ENCOUNTER — Observation Stay
Admission: EM | Admit: 2022-08-20 | Discharge: 2022-08-20 | Disposition: A | Discharge status: Home / Self Care | Payer: Medicaid Other | Attending: Obstetrics and Gynecology | Admitting: Obstetrics and Gynecology

## 2022-08-20 ENCOUNTER — Other Ambulatory Visit: Payer: Self-pay | Admitting: Obstetrics and Gynecology

## 2022-08-20 ENCOUNTER — Encounter: Payer: Self-pay | Admitting: Obstetrics and Gynecology

## 2022-08-20 ENCOUNTER — Other Ambulatory Visit: Payer: Self-pay

## 2022-08-20 ENCOUNTER — Observation Stay: Payer: Medicaid Other

## 2022-08-20 DIAGNOSIS — O99333 Smoking (tobacco) complicating pregnancy, third trimester: Secondary | ICD-10-CM | POA: Diagnosis not present

## 2022-08-20 DIAGNOSIS — O99213 Obesity complicating pregnancy, third trimester: Secondary | ICD-10-CM

## 2022-08-20 DIAGNOSIS — F17210 Nicotine dependence, cigarettes, uncomplicated: Secondary | ICD-10-CM | POA: Insufficient documentation

## 2022-08-20 DIAGNOSIS — O0990 Supervision of high risk pregnancy, unspecified, unspecified trimester: Secondary | ICD-10-CM | POA: Insufficient documentation

## 2022-08-20 DIAGNOSIS — Z3A40 40 weeks gestation of pregnancy: Secondary | ICD-10-CM | POA: Diagnosis not present

## 2022-08-20 DIAGNOSIS — O368390 Maternal care for abnormalities of the fetal heart rate or rhythm, unspecified trimester, not applicable or unspecified: Secondary | ICD-10-CM | POA: Diagnosis present

## 2022-08-20 DIAGNOSIS — O288 Other abnormal findings on antenatal screening of mother: Principal | ICD-10-CM | POA: Insufficient documentation

## 2022-08-20 NOTE — Progress Notes (Signed)
Dating: EDD: 08/18/22  by LMP: 11/11/21 and c/w [place] at 25+1 wks.   Preg c/b: Morbid obesity, BMI 41.4, weight gain approx 30lbs Tobacco use, quit approx 05/2021; currently vapes.  Psoriatic arthritis- stopped meds during pregnancy Prior LTCS for NRFHR during IOL, desires VBAC Hx wound seromas, delayed CS healing for 29yr.  Hx lumbar discectomy, seen by [facility] anesthesia 07/01/22 Late PNC at 23wks Rubella NON-immune Mild anemia, on iron supplements  Suspected hypospadias, micropenis or ambiguous genitalia, scrotum seen on anatomy US with MFM, female fetus by NIPT  Prenatal Labs: Blood type/Rh O Pos  Antibody screen neg  Rubella NON-Immune  Varicella Immune  RPR NR  HBsAg Neg  HIV NR  GC neg  Chlamydia neg  Genetic screening negative  1 hour GTT 126, A1C 5.6  3 hour GTT   GBS NEG    Contraception: BTL, consent signed 06/03/22 Infant feeding: breast Tdap: 06/03/22 Flu: declined

## 2022-08-20 NOTE — Discharge Instructions (Signed)
Please return to labor and delivery tomorrow morning 08/21/22 at 0500 for your induction. Eat something good for breakfast before!

## 2022-08-20 NOTE — Progress Notes (Signed)
Discharge instructions provided to patient. Patient verbalized understanding. Pt educated on signs and symptoms of labor, vaginal bleeding, LOF, and fetal movement. Red flag signs reviewed by RN. Patient discharged home with significant other in stable condition.  

## 2022-08-20 NOTE — OB Triage Note (Signed)
Patient is a 36 yo, G2P1, at 40 weeks 2 days. Patient presents to labor and delivery triage after being sent from the office for a nonreactive NST. Patient denies any vaginal bleeding or LOF. Patient reports +FM. Patient reports occasional braxton hicks contractions and has noticed an increase in contractions since this past Saturday. Patient denies any pain. Monitors applied and assessing. VSS. Initial fetal heart tone 145. McVey, CNM notified of patients arrival to unit. Plan to place in observation for fetal monitoring.

## 2022-08-20 NOTE — Discharge Summary (Signed)
Colleen Navarro is a 36 y.o. female. She is at [redacted]w[redacted]d gestation. Patient's last menstrual period was 11/11/2021. Estimated Date of Delivery: 08/18/22  Prenatal care site: [facility]   Current pregnancy complicated by:  Morbid obesity, BMI 41.4, weight gain approx 30lbs Tobacco use, quit approx 05/2021; currently vapes.  Psoriatic arthritis- stopped meds during pregnancy Prior LTCS for NRFHR during IOL, desires VBAC Hx wound seromas, delayed CS healing for 49yr.  Hx lumbar discectomy, seen by [facility] anesthesia 07/01/22 Late PNC at 23wks Rubella NON-immune Mild anemia, on iron supplements  Suspected hypospadias, micropenis or ambiguous genitalia, scrotum seen on anatomy US with MFM, female fetus by NIPT  Chief complaint: sent from office due to [facility] on routine NST today.    S: Resting comfortably. no CTX, no VB.no LOF,  Active fetal movement. Denies: HA, visual changes, SOB, or RUQ/epigastric pain  Maternal Medical History:   Past Medical History:  Diagnosis Date   Anemia    hx of   Chronic back pain    low back   GERD (gastroesophageal reflux disease)    Headache(784.0)    occasional and hx of migraines   Psoriasis    Urinary tract infection    hx of    Past Surgical History:  Procedure Laterality Date   CHOLECYSTECTOMY     COSMETIC SURGERY     right cheek (face)   LUMBAR LAMINECTOMY/DECOMPRESSION MICRODISCECTOMY Bilateral 12/04/2012   Procedure: LUMBAR LAMINECTOMY/DECOMPRESSION MICRODISCECTOMY 1 LEVEL;  Surgeon: Carmela Hurt, MD;  Location: [facility];  Service: Neurosurgery;  Laterality: Bilateral;  Bilateral Lumbar four-five  diskectomy    Allergies  Allergen Reactions   Bactrim [Sulfamethoxazole-Trimethoprim] Shortness Of Breath   Iodine Hives   Neosporin [Neomycin-Bacitracin Zn-Polymyx] Rash    No trouble breating    Prior to Admission medications   Medication Sig Start Date End Date Taking? Authorizing Provider  Cholecalciferol (VITAMIN D3) 1.25 MG  (50000 UT) CAPS Take 1 capsule by mouth daily.   Yes [provider]  Magnesium 500 MG TABS Take 1 tablet by mouth daily.   Yes [provider]  celecoxib (CELEBREX) 200 MG capsule Take 200 mg by mouth every evening. Patient not taking: Reported on 07/18/2022    [provider]  cetirizine (ZYRTEC) 10 MG tablet Take 10 mg by mouth every evening. Patient not taking: Reported on 07/18/2022    [provider]  cyclobenzaprine (FLEXERIL) 10 MG tablet Take 1 tablet (10 mg total) by mouth at bedtime. Patient not taking: Reported on 07/18/2022 12/05/12   Colon Branch, MD  HYDROcodone-acetaminophen (NORCO/VICODIN) 5-325 MG per tablet Take 1-2 tablets by mouth every 4 (four) hours as needed. Patient not taking: Reported on 07/18/2022 12/05/12   Colon Branch, MD      Social History: She  reports that she has been smoking cigarettes. She has a 5.00 pack-year smoking history. She does not have any smokeless tobacco history on file. She reports current alcohol use. She reports that she does not use drugs.  Family History: family history is not on file.   Review of Systems: A full review of systems was performed and negative except as noted in the HPI.     O:  BP (!) 108/46 (BP Location: Left Arm)   Pulse 95   Temp 98.4 F (36.9 C) (Oral)   Resp 20   Ht 5\' 7"  (1.702 m)   Wt 131.9 kg   LMP 11/11/2021   BMI 45.54 kg/m  No results found  for this or any previous visit (from the past 48 hour(s)).   Constitutional: NAD, AAOx3  HE/ENT: extraocular movements grossly intact, moist mucous membranes CV: RRR PULM: nl respiratory effort, CTABL     Abd: gravid, non-tender, non-distended, soft      Ext: Non-tender, Nonedematous   Psych: mood appropriate, speech normal Pelvic: SVE 1/50/-2, soft/anterior.   Fetal  monitoring: Cat I Appropriate for GA Baseline: 135bpm Variability: moderate Accelerations: present x >2 Decelerations absent Time 32mins  Toco: occasional  mild.   BPP 8/8  AFI 14cm per verbal report from [place] tech  A/P: 36 y.o. [redacted]w[redacted]d here for antenatal surveillance for nonreassuring FHR in office today.   Principle Diagnosis:  High risk pregnancy in third trimester  Labor: not present.  Fetal Wellbeing: Reassuring Cat 1 tracing. Reactive NST and BPP IOL scheduled tomorrow for 40.3wks after discussion with dr Ouida Sills.  D/c home stable, precautions reviewed, follow-up as scheduled.    [place], CNM 08/20/2022  6:00 PM

## 2022-08-21 ENCOUNTER — Other Ambulatory Visit: Payer: Self-pay

## 2022-08-21 ENCOUNTER — Encounter: Payer: Self-pay | Admitting: Obstetrics and Gynecology

## 2022-08-21 ENCOUNTER — Inpatient Hospital Stay: Payer: Medicaid Other | Admitting: Anesthesiology

## 2022-08-21 ENCOUNTER — Encounter: Admission: EM | Disposition: A | Discharge status: Home / Self Care | Payer: Self-pay | Source: Home / Self Care

## 2022-08-21 ENCOUNTER — Inpatient Hospital Stay
Admission: EM | Admit: 2022-08-21 | Discharge: 2022-08-22 | DRG: 784 | Disposition: A | Discharge status: Home / Self Care | Payer: Medicaid Other

## 2022-08-21 DIAGNOSIS — Z98891 History of uterine scar from previous surgery: Secondary | ICD-10-CM

## 2022-08-21 DIAGNOSIS — F17210 Nicotine dependence, cigarettes, uncomplicated: Secondary | ICD-10-CM | POA: Diagnosis present

## 2022-08-21 DIAGNOSIS — O0990 Supervision of high risk pregnancy, unspecified, unspecified trimester: Secondary | ICD-10-CM

## 2022-08-21 DIAGNOSIS — O34211 Maternal care for low transverse scar from previous cesarean delivery: Secondary | ICD-10-CM | POA: Diagnosis present

## 2022-08-21 DIAGNOSIS — Z302 Encounter for sterilization: Secondary | ICD-10-CM | POA: Diagnosis not present

## 2022-08-21 DIAGNOSIS — O9081 Anemia of the puerperium: Secondary | ICD-10-CM | POA: Diagnosis not present

## 2022-08-21 DIAGNOSIS — Z3A40 40 weeks gestation of pregnancy: Secondary | ICD-10-CM

## 2022-08-21 DIAGNOSIS — O0993 Supervision of high risk pregnancy, unspecified, third trimester: Principal | ICD-10-CM

## 2022-08-21 DIAGNOSIS — O09523 Supervision of elderly multigravida, third trimester: Secondary | ICD-10-CM | POA: Diagnosis present

## 2022-08-21 DIAGNOSIS — D62 Acute posthemorrhagic anemia: Secondary | ICD-10-CM | POA: Diagnosis not present

## 2022-08-21 DIAGNOSIS — O480 Post-term pregnancy: Secondary | ICD-10-CM | POA: Diagnosis present

## 2022-08-21 DIAGNOSIS — O99334 Smoking (tobacco) complicating childbirth: Secondary | ICD-10-CM | POA: Diagnosis present

## 2022-08-21 DIAGNOSIS — O99214 Obesity complicating childbirth: Principal | ICD-10-CM | POA: Diagnosis present

## 2022-08-21 DIAGNOSIS — O99213 Obesity complicating pregnancy, third trimester: Secondary | ICD-10-CM | POA: Diagnosis present

## 2022-08-21 LAB — COMPREHENSIVE METABOLIC PANEL
ALT: 24 U/L (ref 0–44)
AST: 22 U/L (ref 15–41)
Albumin: 2.8 g/dL — ABNORMAL LOW (ref 3.5–5.0)
Alkaline Phosphatase: 133 U/L — ABNORMAL HIGH (ref 38–126)
Anion gap: 7 (ref 5–15)
BUN: 7 mg/dL (ref 6–20)
CO2: 19 mmol/L — ABNORMAL LOW (ref 22–32)
Calcium: 9 mg/dL (ref 8.9–10.3)
Chloride: 110 mmol/L (ref 98–111)
Creatinine, Ser: 0.53 mg/dL (ref 0.44–1.00)
GFR, Estimated: >60 mL/min (ref >60)
Glucose, Bld: 105 mg/dL — ABNORMAL HIGH (ref 70–99)
Potassium: 3.7 mmol/L (ref 3.5–5.1)
Sodium: 136 mmol/L (ref 135–145)
Total Bilirubin: 0.3 mg/dL (ref 0.3–1.2)
Total Protein: 6.8 g/dL (ref 6.5–8.1)

## 2022-08-21 LAB — TYPE AND SCREEN
ABO/RH(D): O POS
Antibody Screen: NEGATIVE

## 2022-08-21 LAB — PROTEIN / CREATININE RATIO, URINE
Creatinine, Urine: 100 mg/dL
Protein Creatinine Ratio: 0.16 mg/mg{Cre} — ABNORMAL HIGH (ref 0.00–0.15)
Total Protein, Urine: 16 mg/dL

## 2022-08-21 LAB — CBC
HCT: 34.3 % — ABNORMAL LOW (ref 36.0–46.0)
Hemoglobin: 11.2 g/dL — ABNORMAL LOW (ref 12.0–15.0)
MCH: 28.7 pg (ref 26.0–34.0)
MCHC: 32.7 g/dL (ref 30.0–36.0)
MCV: 87.9 fL (ref 80.0–100.0)
Platelets: 145 10*3/uL — ABNORMAL LOW (ref 150–400)
RBC: 3.9 MIL/uL (ref 3.87–5.11)
RDW: 14.3 % (ref 11.5–15.5)
WBC: 8.8 10*3/uL (ref 4.0–10.5)
nRBC: 0 % (ref 0.0–0.2)

## 2022-08-21 LAB — RPR: RPR Ser Ql: NONREACTIVE

## 2022-08-21 LAB — ABO/RH: ABO/RH(D): O POS

## 2022-08-21 SURGERY — Surgical Case
Anesthesia: Spinal

## 2022-08-21 MED ORDER — LACTATED RINGERS IV SOLN: INTRAVENOUS | Status: DC

## 2022-08-21 MED ORDER — BUPIVACAINE HCL (PF) 0.5 % IJ SOLN
INTRAMUSCULAR | Status: AC
  Filled 2022-08-21: qty 30

## 2022-08-21 MED ORDER — OXYCODONE-ACETAMINOPHEN 5-325 MG PO TABS: 1.0000 | ORAL_TABLET | ORAL | Status: DC | PRN

## 2022-08-21 MED ORDER — ZOLPIDEM TARTRATE 5 MG PO TABS: 5.0000 mg | ORAL_TABLET | Freq: Every evening | ORAL | Status: DC | PRN

## 2022-08-21 MED ORDER — ONDANSETRON HCL 4 MG/2ML IJ SOLN: 4.0000 mg | INTRAMUSCULAR | Status: DC | PRN

## 2022-08-21 MED ORDER — FENTANYL CITRATE (PF) 100 MCG/2ML IJ SOLN
INTRAMUSCULAR | Status: AC
  Filled 2022-08-21: qty 2

## 2022-08-21 MED ORDER — DIPHENHYDRAMINE HCL 25 MG PO CAPS: 25.0000 mg | ORAL_CAPSULE | Freq: Four times a day (QID) | ORAL | Status: DC | PRN

## 2022-08-21 MED ORDER — BUPIVACAINE 0.25 % ON-Q PUMP DUAL CATH 400 ML
400.0000 mL | INJECTION | Status: DC
  Filled 2022-08-21: qty 400

## 2022-08-21 MED ORDER — DEXAMETHASONE SODIUM PHOSPHATE 10 MG/ML IJ SOLN
INTRAMUSCULAR | Status: AC
  Filled 2022-08-21: qty 1

## 2022-08-21 MED ORDER — COCONUT OIL OIL: 1.0000 | TOPICAL_OIL | Status: DC | PRN

## 2022-08-21 MED ORDER — OXYTOCIN BOLUS FROM INFUSION: 333.0000 mL | Freq: Once | INTRAVENOUS | Status: DC

## 2022-08-21 MED ORDER — BUPIVACAINE IN DEXTROSE 0.75-8.25 % IT SOLN
INTRATHECAL | Status: DC | PRN
  Administered 2022-08-21: 1.6 mL via INTRATHECAL

## 2022-08-21 MED ORDER — NALOXONE HCL 4 MG/10ML IJ SOLN: 1.0000 ug/kg/h | INTRAVENOUS | Status: DC | PRN

## 2022-08-21 MED ORDER — ACETAMINOPHEN 325 MG PO TABS
650.0000 mg | ORAL_TABLET | ORAL | Status: DC | PRN
  Administered 2022-08-21: 650 mg via ORAL
  Filled 2022-08-21: qty 2

## 2022-08-21 MED ORDER — SENNOSIDES-DOCUSATE SODIUM 8.6-50 MG PO TABS
2.0000 | ORAL_TABLET | Freq: Every day | ORAL | Status: DC
  Administered 2022-08-22: 2 via ORAL
  Filled 2022-08-21: qty 2

## 2022-08-21 MED ORDER — SOD CITRATE-CITRIC ACID 500-334 MG/5ML PO SOLN
ORAL | Status: AC
  Filled 2022-08-21: qty 15

## 2022-08-21 MED ORDER — WITCH HAZEL-GLYCERIN EX PADS: 1.0000 | MEDICATED_PAD | CUTANEOUS | Status: DC | PRN

## 2022-08-21 MED ORDER — KETOROLAC TROMETHAMINE 30 MG/ML IJ SOLN: 30.0000 mg | Freq: Four times a day (QID) | INTRAMUSCULAR | Status: AC

## 2022-08-21 MED ORDER — PRENATAL MULTIVITAMIN CH: 1.0000 | ORAL_TABLET | Freq: Every day | ORAL | Status: DC

## 2022-08-21 MED ORDER — MENTHOL 3 MG MT LOZG: 1.0000 | LOZENGE | OROMUCOSAL | Status: DC | PRN

## 2022-08-21 MED ORDER — OXYTOCIN-SODIUM CHLORIDE 30-0.9 UT/500ML-% IV SOLN
2.5000 [IU]/h | INTRAVENOUS | Status: DC
  Administered 2022-08-21: 15 [IU]/h via INTRAVENOUS
  Administered 2022-08-21: 2.5 [IU]/h via INTRAVENOUS
  Filled 2022-08-21 (×2): qty 500

## 2022-08-21 MED ORDER — FENTANYL CITRATE (PF) 100 MCG/2ML IJ SOLN
INTRAMUSCULAR | Status: DC | PRN
  Administered 2022-08-21: 15 ug via INTRATHECAL

## 2022-08-21 MED ORDER — BENZOCAINE-MENTHOL 20-0.5 % EX AERO: 1.0000 | INHALATION_SPRAY | CUTANEOUS | Status: DC | PRN

## 2022-08-21 MED ORDER — SODIUM CHLORIDE 0.9% FLUSH
3.0000 mL | Freq: Two times a day (BID) | INTRAVENOUS | Status: DC
  Administered 2022-08-21: 3 mL via INTRAVENOUS

## 2022-08-21 MED ORDER — PHENYLEPHRINE HCL-NACL 20-0.9 MG/250ML-% IV SOLN
INTRAVENOUS | Status: DC | PRN
  Administered 2022-08-21: 80 ug/min via INTRAVENOUS

## 2022-08-21 MED ORDER — NALOXONE HCL 0.4 MG/ML IJ SOLN: 0.4000 mg | INTRAMUSCULAR | Status: DC | PRN

## 2022-08-21 MED ORDER — OXYTOCIN-SODIUM CHLORIDE 30-0.9 UT/500ML-% IV SOLN
1.0000 m[IU]/min | INTRAVENOUS | Status: DC
  Administered 2022-08-21: 2 m[IU]/min via INTRAVENOUS
  Filled 2022-08-21: qty 500

## 2022-08-21 MED ORDER — IBUPROFEN 600 MG PO TABS
600.0000 mg | ORAL_TABLET | Freq: Four times a day (QID) | ORAL | Status: DC
  Administered 2022-08-22 (×3): 600 mg via ORAL
  Filled 2022-08-21 (×3): qty 1

## 2022-08-21 MED ORDER — DIBUCAINE (PERIANAL) 1 % EX OINT: 1.0000 | TOPICAL_OINTMENT | CUTANEOUS | Status: DC | PRN

## 2022-08-21 MED ORDER — ACETAMINOPHEN 500 MG PO TABS: 1000.0000 mg | ORAL_TABLET | Freq: Four times a day (QID) | ORAL | Status: DC

## 2022-08-21 MED ORDER — MORPHINE SULFATE (PF) 0.5 MG/ML IJ SOLN
INTRAMUSCULAR | Status: AC
  Filled 2022-08-21: qty 10

## 2022-08-21 MED ORDER — SIMETHICONE 80 MG PO CHEW: 80.0000 mg | CHEWABLE_TABLET | ORAL | Status: DC | PRN

## 2022-08-21 MED ORDER — FENTANYL CITRATE (PF) 100 MCG/2ML IJ SOLN: 50.0000 ug | INTRAMUSCULAR | Status: DC | PRN

## 2022-08-21 MED ORDER — DIPHENHYDRAMINE HCL 25 MG PO CAPS: 25.0000 mg | ORAL_CAPSULE | ORAL | Status: DC | PRN

## 2022-08-21 MED ORDER — SENNOSIDES-DOCUSATE SODIUM 8.6-50 MG PO TABS: 2.0000 | ORAL_TABLET | ORAL | Status: DC

## 2022-08-21 MED ORDER — OXYTOCIN 10 UNIT/ML IJ SOLN
INTRAMUSCULAR | Status: AC
  Filled 2022-08-21: qty 2

## 2022-08-21 MED ORDER — DIPHENHYDRAMINE HCL 50 MG/ML IJ SOLN: 12.5000 mg | INTRAMUSCULAR | Status: DC | PRN

## 2022-08-21 MED ORDER — SOD CITRATE-CITRIC ACID 500-334 MG/5ML PO SOLN: 30.0000 mL | ORAL | Status: DC | PRN

## 2022-08-21 MED ORDER — FERROUS SULFATE 325 (65 FE) MG PO TABS: 325.0000 mg | ORAL_TABLET | Freq: Two times a day (BID) | ORAL | Status: DC

## 2022-08-21 MED ORDER — FERROUS SULFATE 325 (65 FE) MG PO TABS
325.0000 mg | ORAL_TABLET | Freq: Two times a day (BID) | ORAL | Status: DC
  Administered 2022-08-22 (×2): 325 mg via ORAL
  Filled 2022-08-21 (×3): qty 1

## 2022-08-21 MED ORDER — ONDANSETRON HCL 4 MG/2ML IJ SOLN: 4.0000 mg | Freq: Three times a day (TID) | INTRAMUSCULAR | Status: DC | PRN

## 2022-08-21 MED ORDER — ONDANSETRON HCL 4 MG/2ML IJ SOLN: 4.0000 mg | Freq: Four times a day (QID) | INTRAMUSCULAR | Status: DC | PRN

## 2022-08-21 MED ORDER — SODIUM CHLORIDE 0.9% FLUSH: 3.0000 mL | INTRAVENOUS | Status: DC | PRN

## 2022-08-21 MED ORDER — MORPHINE SULFATE (PF) 0.5 MG/ML IJ SOLN
INTRAMUSCULAR | Status: DC | PRN
  Administered 2022-08-21: .1 mg via INTRATHECAL

## 2022-08-21 MED ORDER — ACETAMINOPHEN 500 MG PO TABS
1000.0000 mg | ORAL_TABLET | Freq: Four times a day (QID) | ORAL | Status: AC
  Administered 2022-08-21 – 2022-08-22 (×4): 1000 mg via ORAL
  Filled 2022-08-21 (×4): qty 2

## 2022-08-21 MED ORDER — SODIUM CHLORIDE 0.9 % IV SOLN: INTRAVENOUS | Status: DC | PRN

## 2022-08-21 MED ORDER — CEFAZOLIN IN SODIUM CHLORIDE 3-0.9 GM/100ML-% IV SOLN
3.0000 g | INTRAVENOUS | Status: AC
  Administered 2022-08-21: 3 g via INTRAVENOUS
  Filled 2022-08-21: qty 100

## 2022-08-21 MED ORDER — AMMONIA AROMATIC IN INHA
RESPIRATORY_TRACT | Status: AC
  Filled 2022-08-21: qty 10

## 2022-08-21 MED ORDER — SCOPOLAMINE 1 MG/3DAYS TD PT72: 1.0000 | MEDICATED_PATCH | Freq: Once | TRANSDERMAL | Status: DC

## 2022-08-21 MED ORDER — LACTATED RINGERS IV SOLN: 500.0000 mL | INTRAVENOUS | Status: DC | PRN

## 2022-08-21 MED ORDER — BUPIVACAINE HCL (PF) 0.5 % IJ SOLN
INTRAMUSCULAR | Status: AC
  Filled 2022-08-21: qty 10

## 2022-08-21 MED ORDER — SIMETHICONE 80 MG PO CHEW
80.0000 mg | CHEWABLE_TABLET | Freq: Three times a day (TID) | ORAL | Status: DC
  Administered 2022-08-22 (×3): 80 mg via ORAL
  Filled 2022-08-21 (×3): qty 1

## 2022-08-21 MED ORDER — PHENYLEPHRINE HCL-NACL 20-0.9 MG/250ML-% IV SOLN
INTRAVENOUS | Status: AC
  Filled 2022-08-21: qty 250

## 2022-08-21 MED ORDER — TERBUTALINE SULFATE 1 MG/ML IJ SOLN
0.2500 mg | Freq: Once | INTRAMUSCULAR | Status: DC | PRN
  Filled 2022-08-21: qty 1

## 2022-08-21 MED ORDER — ONDANSETRON HCL 4 MG PO TABS: 4.0000 mg | ORAL_TABLET | ORAL | Status: DC | PRN

## 2022-08-21 MED ORDER — OXYTOCIN-SODIUM CHLORIDE 30-0.9 UT/500ML-% IV SOLN: 2.5000 [IU]/h | INTRAVENOUS | Status: AC

## 2022-08-21 MED ORDER — ONDANSETRON HCL 4 MG/2ML IJ SOLN
INTRAMUSCULAR | Status: AC
  Filled 2022-08-21: qty 2

## 2022-08-21 MED ORDER — LIDOCAINE HCL (PF) 1 % IJ SOLN: 30.0000 mL | INTRAMUSCULAR | Status: DC | PRN

## 2022-08-21 MED ORDER — OXYCODONE-ACETAMINOPHEN 5-325 MG PO TABS: 2.0000 | ORAL_TABLET | ORAL | Status: DC | PRN

## 2022-08-21 MED ORDER — OXYCODONE HCL 5 MG PO TABS
5.0000 mg | ORAL_TABLET | ORAL | Status: AC | PRN
  Filled 2022-08-21: qty 1

## 2022-08-21 MED ORDER — IBUPROFEN 600 MG PO TABS: 600.0000 mg | ORAL_TABLET | Freq: Four times a day (QID) | ORAL | Status: DC

## 2022-08-21 MED ORDER — BUPIVACAINE HCL (PF) 0.5 % IJ SOLN
30.0000 mL | Freq: Once | INTRAMUSCULAR | Status: DC
  Filled 2022-08-21: qty 30

## 2022-08-21 MED ORDER — MISOPROSTOL 200 MCG PO TABS
ORAL_TABLET | ORAL | Status: AC
  Filled 2022-08-21: qty 4

## 2022-08-21 MED ORDER — ENOXAPARIN SODIUM 40 MG/0.4ML IJ SOSY
40.0000 mg | PREFILLED_SYRINGE | INTRAMUSCULAR | Status: DC
  Administered 2022-08-22: 40 mg via SUBCUTANEOUS
  Filled 2022-08-21 (×2): qty 0.4

## 2022-08-21 MED ORDER — SOD CITRATE-CITRIC ACID 500-334 MG/5ML PO SOLN
30.0000 mL | ORAL | Status: AC
  Administered 2022-08-21: 30 mL via ORAL

## 2022-08-21 MED ORDER — ONDANSETRON HCL 4 MG/2ML IJ SOLN
INTRAMUSCULAR | Status: DC | PRN
  Administered 2022-08-21: 4 mg via INTRAVENOUS

## 2022-08-21 MED ORDER — SODIUM CHLORIDE 0.9 % IV SOLN: 250.0000 mL | INTRAVENOUS | Status: DC | PRN

## 2022-08-21 MED ORDER — LIDOCAINE HCL (PF) 1 % IJ SOLN
INTRAMUSCULAR | Status: AC
  Filled 2022-08-21: qty 30

## 2022-08-21 MED ORDER — KETOROLAC TROMETHAMINE 30 MG/ML IJ SOLN
30.0000 mg | Freq: Four times a day (QID) | INTRAMUSCULAR | Status: AC
  Administered 2022-08-21 – 2022-08-22 (×2): 30 mg via INTRAVENOUS
  Filled 2022-08-21 (×2): qty 1

## 2022-08-21 MED ORDER — DEXAMETHASONE SODIUM PHOSPHATE 10 MG/ML IJ SOLN
INTRAMUSCULAR | Status: DC | PRN
  Administered 2022-08-21: 10 mg via INTRAVENOUS

## 2022-08-21 SURGICAL SUPPLY — 36 items
CATH KIT ON-Q SILVERSOAK 5 (CATHETERS) ×2 IMPLANT
CATH KIT ON-Q SILVERSOAK 5IN (CATHETERS) ×2 IMPLANT
DERMABOND ADVANCED .7 DNX12 (GAUZE/BANDAGES/DRESSINGS) ×1 IMPLANT
DRSG OPSITE POSTOP 4X10 (GAUZE/BANDAGES/DRESSINGS) ×1 IMPLANT
DRSG TELFA 3X8 NADH STRL (GAUZE/BANDAGES/DRESSINGS) ×1 IMPLANT
ELECT CAUTERY BLADE 6.4 (BLADE) ×1 IMPLANT
ELECT REM PT RETURN 9FT ADLT (ELECTROSURGICAL) ×1
ELECTRODE REM PT RTRN 9FT ADLT (ELECTROSURGICAL) ×1 IMPLANT
EXTRACTOR VACUUM KIWI (MISCELLANEOUS) IMPLANT
EXTRT SYSTEM ALEXIS 17CM (MISCELLANEOUS) ×1
GAUZE SPONGE 4X4 12PLY STRL (GAUZE/BANDAGES/DRESSINGS) ×1 IMPLANT
GLOVE BIO SURGEON STRL SZ7 (GLOVE) ×1 IMPLANT
GLOVE SURG UNDER LTX SZ7.5 (GLOVE) ×1 IMPLANT
GOWN STRL REUS W/ TWL LRG LVL3 (GOWN DISPOSABLE) ×3 IMPLANT
GOWN STRL REUS W/TWL LRG LVL3 (GOWN DISPOSABLE) ×3
KIT PREVENA INCISION MGT20CM45 (CANNISTER) IMPLANT
MANIFOLD NEPTUNE II (INSTRUMENTS) ×1 IMPLANT
MAT PREVALON FULL STRYKER (MISCELLANEOUS) ×1 IMPLANT
NS IRRIG 1000ML POUR BTL (IV SOLUTION) ×1 IMPLANT
PACK C SECTION AR (MISCELLANEOUS) ×1 IMPLANT
PAD OB MATERNITY 4.3X12.25 (PERSONAL CARE ITEMS) ×2 IMPLANT
PAD PREP 24X41 OB/GYN DISP (PERSONAL CARE ITEMS) ×1 IMPLANT
RETRACTOR TRAXI PANNICULUS (MISCELLANEOUS) IMPLANT
SCRUB CHG 4% DYNA-HEX 4OZ (MISCELLANEOUS) ×1 IMPLANT
STRIP CLOSURE SKIN 1/2X4 (GAUZE/BANDAGES/DRESSINGS) ×1 IMPLANT
SUT MNCRL 4-0 (SUTURE) ×1
SUT MNCRL 4-0 27XMFL (SUTURE) ×1
SUT PDS AB 1 TP1 96 (SUTURE) ×1 IMPLANT
SUT PLAIN GUT 0 (SUTURE) IMPLANT
SUT VIC AB 0 CTX 36 (SUTURE) ×2
SUT VIC AB 0 CTX36XBRD ANBCTRL (SUTURE) ×2 IMPLANT
SUTURE MNCRL 4-0 27XMF (SUTURE) ×1 IMPLANT
SWABSTK COMLB BENZOIN TINCTURE (MISCELLANEOUS) ×1 IMPLANT
SYSTEM CONTND EXTRCTN KII BLLN (MISCELLANEOUS) IMPLANT
TRAP FLUID SMOKE EVACUATOR (MISCELLANEOUS) ×1 IMPLANT
WATER STERILE IRR 500ML POUR (IV SOLUTION) ×1 IMPLANT

## 2022-08-21 NOTE — Plan of Care (Signed)
Care plan complete

## 2022-08-21 NOTE — Op Note (Signed)
Cesarean Section Operative Note    Patient Name: Colleen Navarro  DOB: 06/19/1986  MRN: [id number]  Date of Surgery: 08/21/2022   Pre-operative Diagnosis:  1) History of cesarean delivery, desires repeat 2) desires permanent sterilization 3) suspected fetal macrosomia 4) intrauterine pregnancy at [id number]   Post-operative Diagnosis:  1) History of cesarean delivery, desires repeat 2) desires permanent sterilization 3) suspected fetal macrosomia 4) intrauterine pregnancy at [id number]   Procedure:  1) Repeat Low transverse cesarean via Pfannenstiel incision 2) Bilateral tubal ligation (Parkland left side and Pomeroy right side due to anatomy)  Surgeon: Surgeon(s) and Role:    * Will Bonnet, MD - Primary   Assistants: Drinda Butts, CNM; No other capable assistant available, in surgery requiring high level assistant.  Anesthesia: spinal   Findings:  1) normal appearing gravid uterus, fallopian tubes, and ovaries 2) Uterine window on left lower uterine segment, very thin lower uterine segment 3) Left fallopian tube mildly adherent to left ovary 4) Viable female infant with weight of 4,320 grams (9 lb 8 oz), APGARs 8 and 9. 5) Infant with thickened cord with indeterminate contents at umbilical insertion (differential per pediatrics)   Quantified Blood Loss: 740 mL  Total IV Fluids: 1,000 ml   Urine Output:  100 mL clear urine at end of procedure  Specimens: portion of right and left fallopian tube  Complications: no complications  Disposition: PACU - hemodynamically stable.   Maternal Condition: stable   Baby condition / location:  Couplet care / Skin to Skin  Procedure Details:  The patient was seen in the Holding Room. The risks, benefits, complications, treatment options, and expected outcomes were discussed with the patient. The patient concurred with the proposed plan, giving informed consent. identified as Colleen Navarro and the procedure verified as C-Section  Delivery. A Time Out was held and the above information confirmed.   After induction of anesthesia, the patient was draped and prepped in the usual sterile manner. A Pfannenstiel incision was made and carried down through the subcutaneous tissue to the fascia. Fascial incision was made and extended transversely. The fascia was separated from the underlying rectus tissue superiorly and inferiorly. The peritoneum was identified and entered. Peritoneal incision was extended longitudinally. An Alexis retractor was placed ensuring the bowel was not included.  The bladder flap was bluntly and sharply freed from the lower uterine segment. A uterine window was noted.  A low transverse uterine incision was made with a single, very shallow pass of the scalpel and the hysterotomy was extended with cranial-caudal tension. Delivered from cephalic presentation was a 4,320 gram Living newborn infant(s) or Female with Apgar scores of 8 at one minute and 9 at five minutes. Cord ph was not sent the umbilical cord was clamped and cut cord blood was obtained for evaluation. The placenta was removed Intact and appeared normal. The uterine outline, tubes and ovaries appeared normal. The uterine incision was closed with running locked sutures of 0 Vicryl.  A second layer of the same suture was thrown in an imbricating fashion.  Hemostasis was assured.    The tubal ligation portion of the procedure was performed at this point.  The left fallopian tube was identified and followed out to the fimbriated end.  A Babcock clamp was used to grasp the tube in the mid-isthmic portion and two 2-0 plain gut sutures were used to ligate the tube using the Parkland technique.  An approximately 3cm segment of tube was removed with hemostasis assured.  The same procedure was performed on the right fallopian tube (except using a Pomeroy technique) with hemostasis noted.    The uterus was returned to the abdomen and the paracolic gutters were cleared of  all clots and debris.  The rectus muscles were inspected and found to be hemostatic.  The fascia was then reapproximated with running sutures of 1-0 PDS, looped. The subcutaneous tissue was reapproximated using 2-0 plain gut such that no greater than 2cm of dead space remained. The subcuticular closure was performed using 4-0 monocryl. The skin closure was reinforced using benzoin and 1/2" steri-strips.  The Provena wound vac was placed for wound infection/seroma prevention.   The surgical assistant performed tissue retraction, assistance with suturing, and fundal pressure.  Instrument, sponge, and needle counts were correct prior the abdominal closure and were correct at the conclusion of the case.  The patient received Ancef 3 gram IV prior to skin incision (within 30 minutes). For VTE prophylaxis she was wearing SCDs throughout the case.  The assistant surgeon was a CNM due to lack of availability of another Sales promotion account executive.    Signed: Conard Novak, MD 08/21/2022 5:59 PM

## 2022-08-21 NOTE — Progress Notes (Signed)
Given the decision to induce patient, I had a discussion with her and her partner about a TOLAC (atempted VBAC).  DIscussed that now that we are at the point of delivery, we need to make a decision regarding whether to attempt to have a TOLAC vs go directly to c-section. I outlined that while I would like to have everyone be able to have a vaginal delivery after having had a c-section, not everyone is a good candidate. I believe that in her case she is not the ideal candidate because: 1) she has an estiimated 47% chance of success based on the [facility] calculator. 2) the last growth ultrasound showed the baby measuring at 97th%ile 3) She has an unfavorable cervix 4) She had poor wound healing with her last c-section, which was unplanned (I do not know that this directly leads to poor healing of the uterine closure, but it is an extra piece of information that we have). 5) Also she might have a higher risk of poor wound healing if we have to do a c-section after she has been attempting labor for some time (risks go up for infection with unplanned c-section).  Discussed all the above in the context of today being the day that she comes in for delivery with the original plan for induction.  I acknowledged that the timing is not ideal to be given this information. However, sometimes the best advice about chance of successful VBAC can only be given close to the time of delivery since that is when we know the clinical circumstance (size of baby, medical conditions, dilation and readiness for labor with either induction or presentation with active labor).    She was understandably upset.  All questions were answered. She would like to consider and decide after having some time to think about it.  I made myself available for follow up questions.    Ultimately, my recommendation is to go directly to c-section for her, given the many risks and lower likelihood of success and the potential associated morbidity for the  mom and perhaps the baby.    She has previously been counseled about the risk of uterine rupture. This was only very briefly touched on today.  Prentice Docker, MD, [facility] 08/21/2022 9:26 AM

## 2022-08-21 NOTE — Transfer of Care (Signed)
Immediate Anesthesia Transfer of Care Note  Patient: Colleen Navarro  Procedure(s) Performed: CESAREAN SECTION  Patient Location: PACU and Nursing Unit  Anesthesia Type:Spinal  Level of Consciousness: awake, alert , and oriented  Airway & Oxygen Therapy: Patient Spontanous Breathing  Post-op Assessment: Report given to RN and Post -op Vital signs reviewed and stable  Post vital signs: Reviewed and stable  Last Vitals:  Vitals Value Taken Time  BP    Temp    Pulse    Resp    SpO2      Last Pain:  Vitals:   08/21/22 1350  TempSrc: Oral  PainSc:          Complications: No notable events documented.

## 2022-08-21 NOTE — Anesthesia Procedure Notes (Signed)
Spinal  Patient location during procedure: OR Start time: 08/21/2022 4:12 PM End time: 08/21/2022 4:17 PM Reason for block: surgical anesthesia Staffing Performed: resident/CRNA  Resident/CRNA: Cammie Sickle, CRNA Performed by: Cammie Sickle, CRNA Authorized by: Arita Miss, MD   Preanesthetic Checklist Completed: patient identified, IV checked, site marked, risks and benefits discussed, surgical consent, monitors and equipment checked, pre-op evaluation and timeout performed Spinal Block Patient position: sitting Prep: ChloraPrep Patient monitoring: heart rate, continuous pulse ox, blood pressure and cardiac monitor Approach: midline Location: L3-4 Injection technique: single-shot Needle Needle type: Introducer and Pencan  Needle gauge: 24 G Needle length: 10 cm Assessment Sensory level: T4 Events: CSF return Additional Notes Negative paresthesia. Negative blood return. Positive free-flowing CSF. Expiration date of kit checked and confirmed. Patient tolerated procedure well, without complications. X1 attempt successfully, pt. Tolerated procedure well. No complications noted.

## 2022-08-21 NOTE — Hospital Course (Addendum)
Risk assessment for postpartum VTE and prophylactic treatment: Very high risk factors: None High risk factors: If > 1 risk factor OR 1 risk factor + 1 moderate risk factor: 3-6 weeks of LMWH and BMI 40-50 kg/m2 Moderate risk factors: Cesarean delivery   Postpartum VTE prophylaxis with LMWH ordered

## 2022-08-21 NOTE — H&P (Signed)
OB History & Physical   History of Present Illness:  Chief Complaint: induction  HPI:  Colleen Navarro is a 36 y.o. G2P1001 female at [redacted]w[redacted]d dated by LMP and c/w [place] at 25wks; EDD 08/18/22.  She presents to L&D for scheduled term IOL due to Obesity, AMA.   Active FM; reports occasional UCs, denies LOF or VB    Pregnancy Issues: Morbid obesity, BMI 41.4, weight gain approx 30lbs Tobacco use, quit approx 05/2021; currently vapes.  Psoriatic arthritis- stopped meds (Taltz) during pregnancy around 23wks Prior LTCS for NRFHR during IOL, desires VBAC Hx wound seromas, delayed CS healing for 18yr.  Hx lumbar discectomy, seen by [facility] anesthesia 07/01/22 Late PNC at 23wks Rubella NON-immune Mild anemia, on iron supplements  Suspected hypospadias, micropenis or ambiguous genitalia, scrotum seen on anatomy US with MFM, female fetus by NIPT   Maternal Medical History:   Past Medical History:  Diagnosis Date   Anemia    hx of   Chronic back pain    low back   GERD (gastroesophageal reflux disease)    Headache(784.0)    occasional and hx of migraines   Psoriasis    Urinary tract infection    hx of    Past Surgical History:  Procedure Laterality Date   CHOLECYSTECTOMY     COSMETIC SURGERY     right cheek (face)   LUMBAR LAMINECTOMY/DECOMPRESSION MICRODISCECTOMY Bilateral 12/04/2012   Procedure: LUMBAR LAMINECTOMY/DECOMPRESSION MICRODISCECTOMY 1 LEVEL;  Surgeon: Carmela Hurt, MD;  Location: [facility];  Service: Neurosurgery;  Laterality: Bilateral;  Bilateral Lumbar four-five  diskectomy    Allergies  Allergen Reactions   Bactrim [Sulfamethoxazole-Trimethoprim] Shortness Of Breath   Iodine Hives   Neosporin [Neomycin-Bacitracin Zn-Polymyx] Rash    No trouble breating    Prior to Admission medications   Medication Sig Start Date End Date Taking? Authorizing Provider  celecoxib (CELEBREX) 200 MG capsule Take 200 mg by mouth every evening. Patient not taking: Reported on 07/18/2022     [provider]  cetirizine (ZYRTEC) 10 MG tablet Take 10 mg by mouth every evening. Patient not taking: Reported on 07/18/2022    [provider]  Cholecalciferol (VITAMIN D3) 1.25 MG (50000 UT) CAPS Take 1 capsule by mouth daily.    [provider]  cyclobenzaprine (FLEXERIL) 10 MG tablet Take 1 tablet (10 mg total) by mouth at bedtime. Patient not taking: Reported on 07/18/2022 12/05/12   Colon Branch, MD  HYDROcodone-acetaminophen (NORCO/VICODIN) 5-325 MG per tablet Take 1-2 tablets by mouth every 4 (four) hours as needed. Patient not taking: Reported on 07/18/2022 12/05/12   Colon Branch, MD  Magnesium 500 MG TABS Take 1 tablet by mouth daily.    [provider]     Prenatal care site: [facility]   Social History: She  reports that she has been smoking cigarettes. She has a 5.00 pack-year smoking history. She does not have any smokeless tobacco history on file. She reports current alcohol use. She reports that she does not use drugs.  Family History: no family hx Gyn cancers  Review of Systems: A full review of systems was performed and negative except as noted in the HPI.     Physical Exam:  Vital Signs: BP 121/61 (BP Location: Left Arm)   Pulse (!) 18   Temp 98.7 F (37.1 C) (Oral)   Resp 18   Ht 5\' 7"  (1.702 m)   Wt 131 kg   LMP 11/11/2021   BMI 45.23  kg/m   General: no acute distress.  HEENT: normocephalic, atraumatic Heart: regular rate & rhythm.  No murmurs/rubs/gallops Lungs: clear to auscultation bilaterally, normal respiratory effort Abdomen: soft, gravid, non-tender;  EFW: 8+;  - last EFW 07/18/22 3388gm, 97%ile  Pelvic:   External: Normal external female genitalia  Cervix: Dilation: 1 / Effacement (%): 50 / Station: -2    Extremities: non-tender, symmetric, No edema bilaterally.  DTRs: 2+  Neurologic: Alert & oriented x 3.    Results for orders placed or performed during the hospital encounter of 08/21/22  (from the past 24 hour(s))  CBC     Status: Abnormal   Collection Time: 08/21/22  5:22 AM  Result Value Ref Range   WBC 8.8 4.0 - 10.5 K/uL   RBC 3.90 3.87 - 5.11 MIL/uL   Hemoglobin 11.2 (L) 12.0 - 15.0 g/dL   HCT 14.4 (L) 31.5 - 40.0 %   MCV 87.9 80.0 - 100.0 fL   MCH 28.7 26.0 - 34.0 pg   MCHC 32.7 30.0 - 36.0 g/dL   RDW 86.7 61.9 - 50.9 %   Platelets 145 (L) 150 - 400 K/uL   nRBC 0.0 0.0 - 0.2 %  Type and screen     Status: None   Collection Time: 08/21/22  5:22 AM  Result Value Ref Range   ABO/RH(D) O POS    Antibody Screen NEG    Sample Expiration      08/24/2022,2359 Performed at [facility], [address] [place], [place] [address]   Comprehensive metabolic panel     Status: Abnormal   Collection Time: 08/21/22  5:22 AM  Result Value Ref Range   Sodium 136 135 - 145 mmol/L   Potassium 3.7 3.5 - 5.1 mmol/L   Chloride 110 98 - 111 mmol/L   CO2 19 (L) 22 - 32 mmol/L   Glucose, Bld 105 (H) 70 - 99 mg/dL   BUN 7 6 - 20 mg/dL   Creatinine, Ser 2.45 0.44 - 1.00 mg/dL   Calcium 9.0 8.9 - 80.9 mg/dL   Total Protein 6.8 6.5 - 8.1 g/dL   Albumin 2.8 (L) 3.5 - 5.0 g/dL   AST 22 15 - 41 U/L   ALT 24 0 - 44 U/L   Alkaline Phosphatase 133 (H) 38 - 126 U/L   Total Bilirubin 0.3 0.3 - 1.2 mg/dL   GFR, Estimated >98 >33 mL/min   Anion gap 7 5 - 15    Pertinent Results:  Prenatal Labs:  Blood type/Rh O Pos  Antibody screen neg  Rubella NON-Immune  Varicella Immune  RPR NR  HBsAg Neg  HIV NR  GC neg  Chlamydia neg  Genetic screening negative  1 hour GTT 126, A1C 5.6  3 hour GTT    GBS NEG    FHT: 135bpm, mod var, + accels, no decels TOCO: q5-43min, Pitocin started 0620 SVE:  Dilation: 1 / Effacement (%): 50 / Station: -2    Cephalic by leopolds/ confirmed with [place]   US FETAL BPP WO NON STRESS  Result Date: 08/20/2022 CLINICAL DATA:  Non-reassuring electronic fetal moderate increasing. Forty-two weeks 2 days with decels on office monitoring. EXAM:  LIMITED OBSTETRIC ULTRASOUND AND BIOPHYSICAL PROFILE COMPARISON:  07/18/2022 FINDINGS: Number of Fetuses: 1 Heart Rate:  147 bpm Movement: Yes Presentation: Cephalic Placental Location: Posterior and left lateral Previa: No Amniotic Fluid (Subjective):  Within normal limits. AFI: 14 cm BPD:  10.5cm 41w 3d FL: 7.8 cm 40w 0 d MATERNAL  FINDINGS: Cervix: Not well-defined due to gestational age. Uterus/Adnexae: No abnormality visualized. Movement:  2  Time: Less than 30  minutes Breathing: 2 Tone:  2 Amniotic Fluid: 2 Total Score:  8 IMPRESSION: Single live intrauterine gestation in cephalic presentation. Amniotic fluid index of 14 cm. Biophysical profile score is 8 out of 8. This exam is performed on an emergent basis and does not comprehensively evaluate fetal size, dating, or anatomy; follow-up complete OB US should be considered if further fetal assessment is warranted. Electronically Signed   By: Keith Rake M.D.   On: 08/20/2022 19:28   US OB Limited  Result Date: 08/20/2022 CLINICAL DATA:  Non-reassuring electronic fetal monitoring tracing. Forty-two weeks 2 days with decels on office monitoring. EXAM: LIMITED OBSTETRIC ULTRASOUND AND BIOPHYSICAL PROFILE COMPARISON:  07/18/2022 FINDINGS: Number of Fetuses: 1 Heart Rate:  147 bpm Movement: Yes Presentation: Cephalic Placental Location: Posterior and left lateral. Previa: No Amniotic Fluid (Subjective):  Within normal limits. AFI: 14 cm BPD:  10.5cm 41w 3d FL: 7.8 cm 40w 0 d MATERNAL FINDINGS: Cervix:  Not well-defined due to gestational age. Uterus/Adnexae: No abnormality visualized. Movement:  2  Time: Less than 30 minutes Breathing: 2 Tone:  2 Amniotic Fluid: 2 Total Score:  8 IMPRESSION: Single live intrauterine gestation in cephalic presentation. Amniotic fluid index of 14 cm. Biophysical profile score is 8 out of 8. This exam is performed on an emergent basis and does not comprehensively evaluate fetal size, dating, or anatomy; follow-up complete OB  US should be considered if further fetal assessment is warranted. Electronically Signed   By: Keith Rake M.D.   On: 08/20/2022 18:00    Assessment:  Colleen Navarro is a 36 y.o. G107P1001 female at [redacted]w[redacted]d with term induction of labor for obesity, AMA.   Plan:  1. Admit to Labor & Delivery; consents reviewed and obtained - Prior LTCS for [facility], pt desires VBAC, previously counseled in office. Dr Ouida Sills aware of admission and immediately available.   2. Fetal Well being  - Fetal Tracing: cat I - Group B Streptococcus ppx indicated: Neg - Presentation: cephalic confirmed by US/exam   3. Routine OB: - Prenatal labs reviewed, as above - Rh O P os - CBC, T&S, RPR on admit - Clear fluids, IVF  4. Induction of Labor -  Contractions: external toco in place -  Pelvis unproven, adequate for TOL.  -  Plan for induction with Pitocin, consider AROM, balloon -  Plan for continuous fetal monitoring  -  Maternal pain control as desired - Anticipate vaginal delivery  5. Post Partum Planning: Contraception: BTL, consent signed 06/03/22 Infant feeding: breast Tdap: 06/03/22 Flu: declined  Murray Hodgkins Shemicka Cohrs, CNM 08/21/22 6:40 AM

## 2022-08-21 NOTE — Anesthesia Preprocedure Evaluation (Signed)
Anesthesia Evaluation  Patient identified by MRN, date of birth, ID band Patient awake  General Assessment Comment:  Secondary C/S. Previous C/S appears to have been emergent requiring general anesthesia. Patient had a poor experience with this and is hoping for a better experience this time.  Reviewed: Allergy & Precautions, NPO status , Patient's Chart, lab work & pertinent test results  History of Anesthesia Complications Negative for: history of anesthetic complications  Airway Mallampati: III  TM Distance: >3 FB Neck ROM: Full    Dental  (+) Partial Upper   Pulmonary neg sleep apnea, neg COPD, Current Smoker and Patient abstained from smoking.,  vapes   Pulmonary exam normal breath sounds clear to auscultation       Cardiovascular Exercise Tolerance: Good METS(-) hypertension(-) CAD and (-) Past MI negative cardio ROS  (-) dysrhythmias  Rhythm:Regular Rate:Normal - Systolic murmurs    Neuro/Psych  Headaches, negative psych ROS   GI/Hepatic GERD  ,(+)     (-) substance abuse  ,   Endo/Other  neg diabetesMorbid obesity  Renal/GU negative Renal ROS     Musculoskeletal   Abdominal (+) + obese,   Peds  Hematology  (+) Blood dyscrasia, anemia ,   Anesthesia Other Findings Past Medical History: No date: Anemia     Comment:  hx of No date: Chronic back pain     Comment:  low back No date: GERD (gastroesophageal reflux disease) No date: Headache(784.0)     Comment:  occasional and hx of migraines No date: Psoriasis No date: Urinary tract infection     Comment:  hx of  Reproductive/Obstetrics                             Anesthesia Physical Anesthesia Plan  ASA: 3  Anesthesia Plan: Spinal   Post-op Pain Management: Toradol IV (intra-op)*   Induction:   PONV Risk Score and Plan: 3 and Ondansetron and Dexamethasone  Airway Management Planned: Natural Airway  Additional  Equipment:   Intra-op Plan:   Post-operative Plan:   Informed Consent: I have reviewed the patients History and Physical, chart, labs and discussed the procedure including the risks, benefits and alternatives for the proposed anesthesia with the patient or authorized representative who has indicated his/her understanding and acceptance.       Plan Discussed with: CRNA and Surgeon  Anesthesia Plan Comments: (Discussed R/B/A of neuraxial anesthesia technique with patient: - rare risks of spinal/epidural hematoma, nerve damage, infection - Risk of PDPH - Risk of itching - Risk of nausea and vomiting - Risk of conversion to general anesthesia and its associated risks, including sore throat, damage to lips/teeth/oropharynx, and rare risks such as cardiac and respiratory events. - Risk of surgical bleeding requiring blood products - Risk of allergic reactions Discussed the role of CRNA in patient's perioperative care. Patient informed about increased incidence of above perioperative risk, in addition to difficult spinal placement requiring GETA, due to high BMI. Patient understands.   )        Anesthesia Quick Evaluation

## 2022-08-21 NOTE — Discharge Summary (Signed)
Postpartum Discharge Summary    Patient Name: Colleen Navarro DOB: 1986-08-12 MRN: [id number]  Date of admission: 08/21/2022 Delivery date:08/21/2022  Delivering provider: Prentice Docker D  Date of discharge: 08/22/2022  Admitting diagnosis:   Supervision of high risk pregnancy, antepartumIntrauterine pregnancy: [redacted]w[redacted]d    Secondary diagnosis:  Principal Problem: Obesity affecting pregnancy in third trimester [O99.213] Active Problems:   Obesity affecting pregnancy in third trimester   AMA (advanced maternal age) multigravida 35+, third trimester   Previous cesarean section   [redacted] weeks gestation of pregnancy   Encounter for sterilization  Additional problems: none    Discharge diagnosis: Term Pregnancy Delivered                                              Post partum procedures: none Augmentation: N/A Complications: None  Hospital course: Sceduled C/S   36y.o. yo G2P2002 at 422w3das admitted to the hospital 08/21/2022 for scheduled cesarean section with the following indication:Elective Repeat.Delivery details are as follows:  Membrane Rupture Time/Date: 4:51 PM ,08/21/2022   Delivery Method:C-Section, Low Transverse  Details of operation can be found in separate operative note.  Patient had a postpartum course complicated by none.  She is ambulating, tolerating a regular diet, passing flatus, and urinating well. Patient is discharged home in stable condition on  08/22/22        Newborn Data: Birth date:08/21/2022  Birth time:4:53 PM  Gender:Female  Living status:Living  Apgars:8 ,9  Weight:4320 g     Magnesium Sulfate received: No BMZ received: No Rhophylac:N/A MMR:No T-DaP:Given prenatally Flu: declined Transfusion:No  Physical exam  Vitals:   08/22/22 1211 08/22/22 1300 08/22/22 1500 08/22/22 1700  BP: (!) 104/54     Pulse: 68 78 86 77  Resp:      Temp: 98.2 F (36.8 C)     TempSrc: Oral     SpO2: 100% 99% 98% 100%  Weight:      Height:       General: alert,  cooperative, and no distress Lochia: appropriate Uterine Fundus: firm Incision: wound vac in place DVT Evaluation: No evidence of DVT seen on physical exam. Labs: Lab Results  Component Value Date   WBC 9.6 08/22/2022   HGB 9.4 (L) 08/22/2022   HCT 29.7 (L) 08/22/2022   MCV 88.1 08/22/2022   PLT 131 (L) 08/22/2022      Latest Ref Rng & Units 08/22/2022    5:10 AM  CMP  Creatinine 0.44 - 1.00 mg/dL 0.72    Edinburgh Score:    08/22/2022   10:46 AM  Edinburgh Postnatal Depression Scale Screening Tool  I have been able to laugh and see the funny side of things. 0  I have looked forward with enjoyment to things. 0  I have blamed myself unnecessarily when things went wrong. 1  I have been anxious or worried for no good reason. 0  I have felt scared or panicky for no good reason. 0  Things have been getting on top of me. 1  I have been so unhappy that I have had difficulty sleeping. 1  I have felt sad or miserable. 1  I have been so unhappy that I have been crying. 1  The thought of harming myself has occurred to me. 0  Edinburgh Postnatal Depression Scale Total 5      After visit  meds:  Allergies as of 08/22/2022       Reactions   Bactrim [sulfamethoxazole-trimethoprim] Shortness Of Breath   Iodine Hives   Neosporin [neomycin-bacitracin Zn-polymyx] Rash   No trouble breating        Medication List     TAKE these medications    acetaminophen 500 MG tablet Commonly known as: TYLENOL Take 2 tablets (1,000 mg total) by mouth every 6 (six) hours.   coconut oil Oil Apply 1 Application topically as needed.   diphenhydrAMINE 25 mg capsule Commonly known as: BENADRYL Take 1 capsule (25 mg total) by mouth every 6 (six) hours as needed for itching.   enoxaparin 40 MG/0.4ML injection Commonly known as: LOVENOX Inject 0.4 mLs (40 mg total) into the skin daily for 28 days.   ferrous sulfate 325 (65 FE) MG tablet Take 1 tablet (325 mg total) by mouth 2 (two) times  daily with a meal.   ibuprofen 600 MG tablet Commonly known as: ADVIL Take 1 tablet (600 mg total) by mouth every 6 (six) hours.   Magnesium 500 MG Tabs Take 1 tablet by mouth daily.   oxyCODONE 5 MG immediate release tablet Commonly known as: Oxy IR/ROXICODONE Take 1 tablet (5 mg total) by mouth every 4 (four) hours as needed for up to 7 days for moderate pain.   prenatal multivitamin Tabs tablet Take 1 tablet by mouth daily at 12 noon.   senna-docusate 8.6-50 MG tablet Commonly known as: Senokot-S Take 2 tablets by mouth daily. Start taking on: August 23, 2022   simethicone 80 MG chewable tablet Commonly known as: MYLICON Chew 1 tablet (80 mg total) by mouth as needed for flatulence.   Vitamin D3 1.25 MG (50000 UT) Caps Take 1 capsule by mouth daily.      Risk assessment for postpartum VTE and prophylactic treatment: Very high risk factors: None High risk factors: BMI 40-50 kg/m2 Moderate risk factors: Cesarean delivery   Postpartum VTE prophylaxis with LMWH ordered  3-6 weeks of LOVENOX 40 mg   Discharge home in stable condition Infant Feeding:  pumping  Infant Disposition:NICU Discharge instruction: per After Visit Summary and Postpartum booklet. Activity: Advance as tolerated. Pelvic rest for 6 weeks.  Diet: routine diet Anticipated Birth Control: BTL done PP Postpartum Appointment:6 weeks Additional Postpartum F/U: Incision check 1 week Future Appointments:No future appointments. Follow up Visit:  Follow-up Information     Will Bonnet, MD. Schedule an appointment as soon as possible for a visit in 1 week(s).   Specialty: Obstetrics and Gynecology Why: For incision check (removal of Wound Vac) Contact information: [place] [address] [phone]         Will Bonnet, MD. Go on 08/27/2022.   Specialty: Obstetrics and Gynecology Why: @ 2:45 Contact information: [address] [place]  [address] [phone]                SIGNED: Francetta Found, CNM 08/22/2022 8:35 PM\

## 2022-08-21 NOTE — Progress Notes (Signed)
D[place] requested to discuss options and had several questions.  After thoughtful consideration, Colleen Navarro opts for a repeat c/section.  She is concerned about avoiding general anesthesia and would like to make this experience better than her last c/section.  I reviewed the process for a c/section.  Will have anesthesia discuss Spinal options.  She desires her partner to be in the room.    She last had a bag of chips at 0700. Will plan her c/section for 1600.   -NPO now -Dr. Glennon Mac notified of patient's desire for repeat c/section -Anesthesia notified  -Planning BTL -Discontinue oxytocin now -EFM for 1 hour and may d/c if tracing is reactive   Drinda Butts, CNM Certified Nurse Midwife [facility]

## 2022-08-21 NOTE — Progress Notes (Signed)
Patient set up to pump. Milk storage handout given.

## 2022-08-22 ENCOUNTER — Encounter: Payer: Self-pay | Admitting: Obstetrics and Gynecology

## 2022-08-22 ENCOUNTER — Other Ambulatory Visit: Payer: Self-pay

## 2022-08-22 LAB — CREATININE, SERUM
Creatinine, Ser: 0.72 mg/dL (ref 0.44–1.00)
GFR, Estimated: >60 mL/min (ref >60)

## 2022-08-22 LAB — CBC
HCT: 29.7 % — ABNORMAL LOW (ref 36.0–46.0)
Hemoglobin: 9.4 g/dL — ABNORMAL LOW (ref 12.0–15.0)
MCH: 27.9 pg (ref 26.0–34.0)
MCHC: 31.6 g/dL (ref 30.0–36.0)
MCV: 88.1 fL (ref 80.0–100.0)
Platelets: 131 10*3/uL — ABNORMAL LOW (ref 150–400)
RBC: 3.37 MIL/uL — ABNORMAL LOW (ref 3.87–5.11)
RDW: 14.3 % (ref 11.5–15.5)
WBC: 9.6 10*3/uL (ref 4.0–10.5)
nRBC: 0 % (ref 0.0–0.2)

## 2022-08-22 MED ORDER — ACETAMINOPHEN 500 MG PO TABS
1000.0000 mg | ORAL_TABLET | Freq: Four times a day (QID) | ORAL | 0 refills | Status: AC
  Filled 2022-08-22: qty 30, 4d supply, fill #0

## 2022-08-22 MED ORDER — FERROUS SULFATE 325 (65 FE) MG PO TABS
325.0000 mg | ORAL_TABLET | Freq: Two times a day (BID) | ORAL | 0 refills | Status: AC
  Filled 2022-08-22: qty 60, 30d supply, fill #0

## 2022-08-22 MED ORDER — OXYCODONE HCL 5 MG PO TABS
5.0000 mg | ORAL_TABLET | ORAL | 0 refills | Status: AC | PRN
  Filled 2022-08-22: qty 30, 5d supply, fill #0

## 2022-08-22 MED ORDER — ENOXAPARIN SODIUM 40 MG/0.4ML IJ SOSY
40.0000 mg | PREFILLED_SYRINGE | INTRAMUSCULAR | 0 refills | Status: AC
  Filled 2022-08-22: qty 11.2, 28d supply, fill #0

## 2022-08-22 MED ORDER — SENNOSIDES-DOCUSATE SODIUM 8.6-50 MG PO TABS
2.0000 | ORAL_TABLET | Freq: Every day | ORAL | 0 refills | Status: AC
  Filled 2022-08-22: qty 60, 30d supply, fill #0

## 2022-08-22 MED ORDER — DIPHENHYDRAMINE HCL 25 MG PO CAPS: 25.0000 mg | ORAL_CAPSULE | Freq: Four times a day (QID) | ORAL | 0 refills | Status: AC | PRN

## 2022-08-22 MED ORDER — COCONUT OIL OIL: 1.0000 | TOPICAL_OIL | 0 refills | Status: AC | PRN

## 2022-08-22 MED ORDER — SIMETHICONE 80 MG PO CHEW
80.0000 mg | CHEWABLE_TABLET | ORAL | 0 refills | Status: AC | PRN
  Filled 2022-08-22: qty 30, fill #0

## 2022-08-22 MED ORDER — IBUPROFEN 600 MG PO TABS
600.0000 mg | ORAL_TABLET | Freq: Four times a day (QID) | ORAL | 0 refills | Status: AC
  Filled 2022-08-22: qty 30, 8d supply, fill #0

## 2022-08-22 MED ORDER — PRENATAL MULTIVITAMIN CH: 1.0000 | ORAL_TABLET | Freq: Every day | ORAL | Status: AC

## 2022-08-22 NOTE — TOC Initial Note (Signed)
Transition of Care [facility]) - Initial/Assessment Note    Patient Details  Name: Colleen Navarro MRN: [id number] Date of Birth: 1986/09/19  Transition of Care [facility] CM/SW Contact:    Shelbie Hutching, RN Phone Number: 08/22/2022, 3:12 PM  Clinical Narrative:                  [facility] acknowledges consult for baby being transferred to outside hospital.  [facility] completing consult if any specific needs to discharge arise please re-consult and note reason in consult.   Patient to discharge to go and be with infant at [facility].         Patient Goals and CMS Choice        Expected Discharge Plan and Services                                                Prior Living Arrangements/Services                       Activities of Daily Living Home Assistive Devices/Equipment: None ADL Screening (condition at time of admission) Patient's cognitive ability adequate to safely complete daily activities?: Yes Is the patient deaf or have difficulty hearing?: No Does the patient have difficulty seeing, even when wearing glasses/contacts?: No Does the patient have difficulty concentrating, remembering, or making decisions?: No Patient able to express need for assistance with ADLs?: Yes Does the patient have difficulty dressing or bathing?: No Independently performs ADLs?: Yes (appropriate for developmental age) Does the patient have difficulty walking or climbing stairs?: No Weakness of Legs: None Weakness of Arms/Hands: None  Permission Sought/Granted                  Emotional Assessment              Admission diagnosis:  Obesity affecting pregnancy in third trimester [O99.213] Patient Active Problem List   Diagnosis Date Noted   [redacted] weeks gestation of pregnancy 08/21/2022   Encounter for sterilization 08/21/2022   Non-reassuring electronic fetal monitoring tracing 08/20/2022   Supervision of high risk pregnancy, antepartum 08/20/2022   Obesity affecting pregnancy  in third trimester 06/03/2022   AMA (advanced maternal age) multigravida 35+, third trimester 06/03/2022   Late prenatal care, antepartum 06/03/2022   Previous cesarean section 06/03/2022   Supervision of high risk pregnancy in third trimester 05/14/2022   Psoriasis 07/06/2019   PCP:  Pcp, No Pharmacy:   [facility] [place] [address] Phone: [phone] Fax: [address] [place] [address] Phone: [phone] Fax: [phone]     Social Determinants of Health (SDOH) Interventions    Readmission Risk Interventions     No data to display

## 2022-08-22 NOTE — Lactation Note (Signed)
Lactation Consultation Note  Patient Name: Colleen Navarro Date: 08/22/2022   Age:36 y.o.  Maternal Data    Follow-up per patients request to assist with pumping and flange sizing.  Lactation Tools Discussed/Used   LC assisted with setting up the pump. Size 16mm flanges applied along with lanolin to help reduce friction between the skin and plastic. Pt feels much more comfortable with pumping with use of the lanolin.   Size 58mm flanges appear to fit well; L breast may be slightly tight, consider moving to next size if it becomes uncomfortable even with use of lanolin.  Interventions   Reviewed pumping routine: Every 3 hours during waking hours, every 4 hours overnight. Pt has spectra S2 at home with size 24 and 23mm flanges.  Discharge   Pt plans to be discharged at 24 hours to be with newborn who was transferred to [facility]. Outpatient lactation number provided; encouraged to call for BF support and pumping support as needed.  Consult Status      Lavonia Drafts 08/22/2022, 1:45 PM

## 2022-08-22 NOTE — Progress Notes (Signed)
Patient d/c home. D/c instructions, Rx, and f/u appt given to and reviewed with pt. Pt verbalized understanding. Escorted out by tech.

## 2022-08-22 NOTE — Progress Notes (Signed)
Post Partum Day 1 Subjective: Doing well, no complaints.  Tolerating regular diet, pain with PO meds, ambulating without difficulty. Has not voided since foley cath removed.   No CP SOB Fever,Chills, N/V or leg pain; denies nipple or breast pain no HA change of vision, RUQ/epigastric pain  Objective: BP (!) 104/54 (BP Location: Right Arm)   Pulse 77   Temp 98.2 F (36.8 C) (Oral)   Resp 16   Ht 5\' 7"  (1.702 m)   Wt 131 kg   LMP 11/11/2021   SpO2 100%   Breastfeeding Unknown   BMI 45.23 kg/m    Physical Exam:  General: NAD Breasts: soft/nontender CV: RRR Pulm: nl effort, CTABL Abdomen: soft, NT, BS x 4 Incision: wound vac remains in place and intact Lochia: small Uterine Fundus: fundus firm and 2 fb below umbilicus DVT Evaluation: no cords, ttp LEs   Recent Labs    08/21/22 0522 08/22/22 0510  HGB 11.2* 9.4*  HCT 34.3* 29.7*  WBC 8.8 9.6  PLT 145* 131*    Assessment/Plan: 36 y.o. G2P2002 postpartum day # 1  - Continue routine PP care - Lactation consult prn- pt pumping for infant who is in NICU at [facility].  - Acute blood loss anemia - hemodynamically stable and asymptomatic; start po ferrous sulfate BID with stool softeners  - Immunization status: all Imms up to date    Disposition: Does desire Dc home today to be with her baby in NICU at outside facility.     Francetta Found, CNM 08/22/2022   Late note entry.

## 2022-08-22 NOTE — Anesthesia Postprocedure Evaluation (Signed)
Anesthesia Post Note  Patient: Colleen Navarro  Procedure(s) Performed: [place]  Patient location during evaluation: Mother Baby Anesthesia Type: Spinal Level of consciousness: oriented and awake and alert Pain management: pain level controlled Vital Signs Assessment: post-procedure vital signs reviewed and stable Respiratory status: spontaneous breathing and respiratory function stable Cardiovascular status: blood pressure returned to baseline and stable Postop Assessment: no headache, no backache, no apparent nausea or vomiting and able to ambulate Anesthetic complications: no   No notable events documented.   Last Vitals:  Vitals:   08/22/22 0600 08/22/22 0800  BP:  (!) 117/53  Pulse: 70 65  Resp:    Temp:  36.7 C  SpO2: 100%     Last Pain:  Vitals:   08/22/22 0800  TempSrc: Oral  PainSc: 1                  Zanita Millman,  Clearnce Sorrel

## 2022-08-22 NOTE — Anesthesia Post-op Follow-up Note (Signed)
  Anesthesia Pain Follow-up Note  Patient: Colleen Navarro  Day #: 1  Date of Follow-up: 08/22/2022 Time: 11:22 AM  Last Vitals:  Vitals:   08/22/22 0600 08/22/22 0800  BP:  (!) 117/53  Pulse: 70 65  Resp:    Temp:  36.7 C  SpO2: 100%     Level of Consciousness: alert  Pain: none   Side Effects:None  Catheter Site Exam:clean, dry  Anti-Coag Meds (From admission, onward)    Start     Dose/Rate Route Frequency Ordered Stop   08/22/22 1900  enoxaparin (LOVENOX) injection 40 mg        40 mg Subcutaneous Every 24 hours 08/21/22 1824     08/22/22 0000  enoxaparin (LOVENOX) 40 MG/0.4ML injection        40 mg Subcutaneous Every 24 hours 08/22/22 1111 09/19/22 2359        Plan: D/C from anesthesia care at surgeon's request  [name],  Clearnce Sorrel

## 2022-08-22 NOTE — Lactation Note (Signed)
Lactation Consultation Note  Patient Name: Colleen Navarro Date: 08/22/2022 Reason for consult: Initial assessment;NICU baby;Term;Other (Comment) (Baby transferred to [facility]) Age:36 y.o.  Maternal Data   P2, c-section yesterday. Baby transferred to [facility] post delivery; patient remains in-patient here to recover.  Feeding Mother's Current Feeding Choice: Breast Milk  LATCH Score    Lactation Tools Discussed/Used Tools: Pump Breast pump type: Double-Electric Breast Pump Pump Education: Setup, frequency, and cleaning;Milk Storage (reviewed by nursing staff and again by [facility] this morning) Reason for Pumping: baby transferred to [place] frequency: q 2-3 hours  LC reviewed all education that had previously been given by nursing staff. Pt notes some discomfort when pumping, will monitor next time.  Interventions Interventions: Hand express;DEBP;Education  Discharge    Consult Status Consult Status: PRN  Pt to call out at next pumping session- 12pm  Lavonia Drafts 08/22/2022, 9:21 AM

## 2022-08-23 ENCOUNTER — Other Ambulatory Visit: Payer: Self-pay

## 2022-08-23 LAB — SURGICAL PATHOLOGY

## 2022-09-02 ENCOUNTER — Ambulatory Visit: Payer: Self-pay

## 2022-09-02 NOTE — Lactation Note (Signed)
This note was copied from a baby's chart. Lactation Consultation Note  Patient Name: Colleen Navarro [id number] Date: 09/02/2022 Reason for consult: Other (Comment);Mother's request (Outpatient consult) Age:36 days  CNM called from [facility] office; mom in for appointment today and worried about baby's weight.  Maternal Data Has patient been taught Hand Expression?: Yes Does the patient have breastfeeding experience prior to this delivery?: No (did not BF her first child)  This is the first child that mom has breastfed. Baby is BF at the breast and receiving EBM 1x/day. Mom reports dx of mastitis on 08/26/2022 in which she is rx antibiotics 4x/day for 10 days; still currently taking.  Feeding Mother's Current Feeding Choice: Breast Milk  Mom reports baby is eating 5-10 minutes from 1 breast every 4 hours during the day and every 4.5-5hrs during the night. Mom is pumping 1x/day and receiving 6oz that she is storing or will sometimes provide via bottle. Birth weight: 9lb8oz 08/26/22 @ pediatrician: 8lb12oz Today's weight: 3670g~ 8lb1.5oz Next pediatrician appointment on: 09/10/2022 per mom  LATCH Score Latch: Grasps breast easily, tongue down, lips flanged, rhythmical sucking.  Audible Swallowing: Spontaneous and intermittent  Type of Nipple: Everted at rest and after stimulation  Comfort (Breast/Nipple): Soft / non-tender  Hold (Positioning): Assistance needed to correctly position infant at breast and maintain latch.  LATCH Score: 9  Baby awake and alert for the feeding. Pre-weight obtained, dry diaper. Mom had good positioning for football hold; added support pillow. Baby latched with minimal supportive efforts, sustained latch and had productive sucking pattern with loud audible swallows for total of 13 minutes. (Transfer of 60mL) Baby moved to the right breast, same position with support pillow, again baby latched with minimal support, sustained latch and had productive suck with  audible swallows for 12 additional minutes. (Transfer of 75ml)  End weight: 3728  Total transfer of approximately 22mL (almost 2oz). Average known intake for infant of same age is 2-2.5oz every 3 hours~ 16-20oz per 24 hours.  Baby's current intake appears to be less with eating less frequently and only feeding from one breast per feeding.  Lactation Tools Discussed/Used Tools:  (had previously been using nipple shield; did not need)  Interventions Interventions: Breast feeding basics reviewed;Hand express;Breast compression;Support pillows;Adjust position;Education  Feeding plan: -Feed minimum of every 3 hours, even throughout the night -Offer  both breasts at each feeding, keeping baby awake/alert/stimulated with breast compressions/massage throughout feeding -Offer 2oz 3x/day post breastfeeding for additional caloric intake.  Discharge Discharge Education: Warning signs for feeding baby;Other (comment) (Encouraged to call for ongoing support as needed) Pump: Personal;Hands Free (DEBP plug-in, just received hands-free pump)  Consult Status Consult Status: Complete  Feeding plan was written down and provided as reference for guidance once at home, along with today's weight so that she can compare baby's weight 1 week from now at the next pediatrician appointment. Outpatient lactation phone number was provided; encouraged to call with questions and ongoing support as needed-   Danford Bad 09/02/2022, 3:29 PM

## 2022-09-08 ENCOUNTER — Encounter: Payer: Self-pay | Admitting: Emergency Medicine

## 2022-09-08 ENCOUNTER — Emergency Department
Admission: EM | Admit: 2022-09-08 | Discharge: 2022-09-08 | Disposition: A | Discharge status: Home / Self Care | Payer: Medicaid Other | Source: Home / Self Care | Attending: Emergency Medicine | Admitting: Emergency Medicine

## 2022-09-08 ENCOUNTER — Observation Stay
Admission: EM | Admit: 2022-09-08 | Discharge: 2022-09-09 | Disposition: A | Discharge status: Home / Self Care | Payer: Medicaid Other | Attending: Emergency Medicine | Admitting: Emergency Medicine

## 2022-09-08 DIAGNOSIS — R001 Bradycardia, unspecified: Secondary | ICD-10-CM | POA: Insufficient documentation

## 2022-09-08 DIAGNOSIS — O1495 Unspecified pre-eclampsia, complicating the puerperium: Secondary | ICD-10-CM | POA: Diagnosis present

## 2022-09-08 DIAGNOSIS — F17210 Nicotine dependence, cigarettes, uncomplicated: Secondary | ICD-10-CM | POA: Diagnosis not present

## 2022-09-08 DIAGNOSIS — O99893 Other specified diseases and conditions complicating puerperium: Secondary | ICD-10-CM | POA: Diagnosis present

## 2022-09-08 DIAGNOSIS — O152 Eclampsia in the puerperium: Secondary | ICD-10-CM | POA: Insufficient documentation

## 2022-09-08 DIAGNOSIS — R519 Headache, unspecified: Secondary | ICD-10-CM | POA: Insufficient documentation

## 2022-09-08 DIAGNOSIS — O135 Gestational [pregnancy-induced] hypertension without significant proteinuria, complicating the puerperium: Secondary | ICD-10-CM | POA: Insufficient documentation

## 2022-09-08 DIAGNOSIS — O1490 Unspecified pre-eclampsia, unspecified trimester: Secondary | ICD-10-CM

## 2022-09-08 LAB — COMPREHENSIVE METABOLIC PANEL
ALT: 21 U/L (ref 0–44)
AST: 40 U/L (ref 15–41)
Albumin: 3.9 g/dL (ref 3.5–5.0)
Alkaline Phosphatase: 66 U/L (ref 38–126)
Anion gap: 6 (ref 5–15)
BUN: 12 mg/dL (ref 6–20)
CO2: 27 mmol/L (ref 22–32)
Calcium: 9.2 mg/dL (ref 8.9–10.3)
Chloride: 108 mmol/L (ref 98–111)
Creatinine, Ser: 0.95 mg/dL (ref 0.44–1.00)
GFR, Estimated: >60 mL/min (ref >60)
Glucose, Bld: 96 mg/dL (ref 70–99)
Potassium: 5.1 mmol/L (ref 3.5–5.1)
Sodium: 141 mmol/L (ref 135–145)
Total Bilirubin: 1.6 mg/dL — ABNORMAL HIGH (ref 0.3–1.2)
Total Protein: 7.6 g/dL (ref 6.5–8.1)

## 2022-09-08 LAB — CBC WITH DIFFERENTIAL/PLATELET
Abs Immature Granulocytes: 0.08 10*3/uL — ABNORMAL HIGH (ref 0.00–0.07)
Basophils Absolute: 0 10*3/uL (ref 0.0–0.1)
Basophils Relative: 1 %
Eosinophils Absolute: 0.3 10*3/uL (ref 0.0–0.5)
Eosinophils Relative: 5 %
HCT: 37 % (ref 36.0–46.0)
Hemoglobin: 11.7 g/dL — ABNORMAL LOW (ref 12.0–15.0)
Immature Granulocytes: 2 %
Lymphocytes Relative: 28 %
Lymphs Abs: 1.5 10*3/uL (ref 0.7–4.0)
MCH: 27.8 pg (ref 26.0–34.0)
MCHC: 31.6 g/dL (ref 30.0–36.0)
MCV: 87.9 fL (ref 80.0–100.0)
Monocytes Absolute: 0.3 10*3/uL (ref 0.1–1.0)
Monocytes Relative: 6 %
Neutro Abs: 3.1 10*3/uL (ref 1.7–7.7)
Neutrophils Relative %: 58 %
Platelets: 193 10*3/uL (ref 150–400)
RBC: 4.21 MIL/uL (ref 3.87–5.11)
RDW: 13 % (ref 11.5–15.5)
WBC: 5.2 10*3/uL (ref 4.0–10.5)
nRBC: 0 % (ref 0.0–0.2)

## 2022-09-08 LAB — PROTEIN / CREATININE RATIO, URINE
Creatinine, Urine: 72 mg/dL
Total Protein, Urine: <6 mg/dL

## 2022-09-08 LAB — URINALYSIS, ROUTINE W REFLEX MICROSCOPIC
Bilirubin Urine: NEGATIVE
Glucose, UA: NEGATIVE mg/dL
Ketones, ur: NEGATIVE mg/dL
Nitrite: NEGATIVE
Protein, ur: NEGATIVE mg/dL
Specific Gravity, Urine: 1.011 (ref 1.005–1.030)
pH: 5 (ref 5.0–8.0)

## 2022-09-08 LAB — MAGNESIUM: Magnesium: 2.2 mg/dL (ref 1.7–2.4)

## 2022-09-08 MED ORDER — IBUPROFEN 600 MG PO TABS: 600.0000 mg | ORAL_TABLET | Freq: Four times a day (QID) | ORAL | Status: DC | PRN

## 2022-09-08 MED ORDER — MAGNESIUM SULFATE 40 GM/1000ML IV SOLN
2.0000 g/h | INTRAVENOUS | Status: DC
  Filled 2022-09-08: qty 1000

## 2022-09-08 MED ORDER — LACTATED RINGERS IV SOLN: INTRAVENOUS | Status: DC

## 2022-09-08 MED ORDER — LABETALOL HCL 5 MG/ML IV SOLN: 40.0000 mg | INTRAVENOUS | Status: DC | PRN

## 2022-09-08 MED ORDER — LABETALOL HCL 5 MG/ML IV SOLN: 80.0000 mg | INTRAVENOUS | Status: DC | PRN

## 2022-09-08 MED ORDER — LABETALOL HCL 5 MG/ML IV SOLN: 20.0000 mg | INTRAVENOUS | Status: DC | PRN

## 2022-09-08 MED ORDER — ACETAMINOPHEN 325 MG PO TABS: 650.0000 mg | ORAL_TABLET | Freq: Four times a day (QID) | ORAL | Status: DC | PRN

## 2022-09-08 MED ORDER — HYDRALAZINE HCL 20 MG/ML IJ SOLN: 10.0000 mg | INTRAMUSCULAR | Status: DC | PRN

## 2022-09-08 MED ORDER — MAGNESIUM SULFATE BOLUS VIA INFUSION
4.0000 g | Freq: Once | INTRAVENOUS | Status: DC
  Filled 2022-09-08: qty 1000

## 2022-09-08 NOTE — ED Notes (Signed)
Received call from [facility] MW Donato Schultz re: wanting to direct admit this patient, advised her that she has not been since the ED yet and will need to be discharged before we can send her up.   I spoke with Pia Mau PA-C to see if she would see patient in flex to get her started with going upstairs and she agreed to see patient in flex after speaking with the MW.

## 2022-09-08 NOTE — ED Triage Notes (Signed)
FIRST NURSE NOTE:  Spoke with Therapist, nutritional in L&D, pt will need to be evaluated down in the ED first

## 2022-09-08 NOTE — ED Triage Notes (Signed)
Pt presents via POV with complaints of postpartum preeclampsia. Pt states she had a c-section on 11/1 and her sx have been present since that time and her PCP advised her to "keep an eye on it." Pt endorses a generalized headache - has taken OTC tylenol, last dose around 1730 without improvement. Denies edema, fevers, N/V, CP or SOB.

## 2022-09-08 NOTE — Discharge Instructions (Signed)
Patient will be directly admitted by certified nurse midwife Donato Schultz

## 2022-09-08 NOTE — ED Provider Notes (Signed)
[facility] Provider Note  Patient Contact: 10:59 PM (approximate)   History   No chief complaint on file.   HPI  Colleen Navarro is a 36 y.o. female G2 P2 who delivered on 11/1 presents to the emergency department with concern for hypertension and headache at home.  Patient denies lower extremity swelling, fever or seizure-like activity at home.  No visual changes.  Certified nurse midwife, Colleen Navarro plans to admit patient directly to the OB/GYN service after evaluation in emergency department.      Physical Exam   Triage Vital Signs: ED Triage Vitals  Enc Vitals Group     BP 09/08/22 1949 (!) 167/73     Pulse Rate 09/08/22 1949 (!) 55     Resp 09/08/22 1949 18     Temp 09/08/22 1949 99.2 F (37.3 C)     Temp Source 09/08/22 1949 Oral     SpO2 09/08/22 1949 100 %     Weight 09/08/22 1948 275 lb (124.7 kg)     Height 09/08/22 1948 5\' 7"  (1.702 m)     Head Circumference --      Peak Flow --      Pain Score 09/08/22 1951 7     Pain Loc --      Pain Edu? --      Excl. in GC? --     Most recent vital signs: Vitals:   09/08/22 1949  BP: (!) 167/73  Pulse: (!) 55  Resp: 18  Temp: 99.2 F (37.3 C)  SpO2: 100%     General: Alert and in no acute distress. Eyes:  PERRL. EOMI. Head: No acute traumatic findings ENT:      Nose: No congestion/rhinnorhea.      Mouth/Throat: Mucous membranes are moist.  Neck: No stridor. No cervical spine tenderness to palpation. Cardiovascular:  Good peripheral perfusion Respiratory: Normal respiratory effort without tachypnea or retractions. Lungs CTAB. Good air entry to the bases with no decreased or absent breath sounds. Gastrointestinal: Bowel sounds 4 quadrants. Soft and nontender to palpation. No guarding or rigidity. No palpable masses. No distention. No CVA tenderness. Musculoskeletal: Full range of motion to all extremities.  Neurologic:  No gross focal neurologic deficits are appreciated.  Skin:    No rash noted Other:   ED Results / Procedures / Treatments   Labs (all labs ordered are listed, but only abnormal results are displayed) Labs Reviewed  CBC WITH DIFFERENTIAL/PLATELET - Abnormal; Notable for the following components:      Result Value   Hemoglobin 11.7 (*)    Abs Immature Granulocytes 0.08 (*)    All other components within normal limits  COMPREHENSIVE METABOLIC PANEL - Abnormal; Notable for the following components:   Total Bilirubin 1.6 (*)    All other components within normal limits  MAGNESIUM  URINALYSIS, ROUTINE W REFLEX MICROSCOPIC  PROTEIN / CREATININE RATIO, URINE      PROCEDURES:  Critical Care performed: No  Procedures   MEDICATIONS ORDERED IN ED: Medications - No data to display   IMPRESSION / MDM / ASSESSMENT AND PLAN / ED COURSE  I reviewed the triage vital signs and the nursing notes.                              Assessment and plan Preeclampsia 36 year old female presents to the emergency department with persistent headache and elevated blood pressure since delivering on 08/21/2022.  Patient  was hypertensive and bradycardic at triage but vital signs otherwise reassuring.   I reached out to certified nurse midwife on-call, Colleen Navarro who stated that she plan to admit the patient directly after evaluation in the emergency department.    Urine protein creatinine ratio in process.  CBC and CMP reassuring.   FINAL CLINICAL IMPRESSION(S) / ED DIAGNOSES   Final diagnoses:  Pre-eclampsia, antepartum     Rx / DC Orders   ED Discharge Orders     None        Note:  This document was prepared using Dragon voice recognition software and may include unintentional dictation errors.   Colleen Mau [place], PA-C 09/08/22 2306    Colleen Creamer, MD 09/09/22 1313

## 2022-09-09 ENCOUNTER — Encounter: Payer: Self-pay | Admitting: Obstetrics and Gynecology

## 2022-09-09 ENCOUNTER — Other Ambulatory Visit: Payer: Self-pay

## 2022-09-09 MED ORDER — NIFEDIPINE ER OSMOTIC RELEASE 30 MG PO TB24
30.0000 mg | ORAL_TABLET | Freq: Every day | ORAL | Status: DC
  Administered 2022-09-09: 30 mg via ORAL
  Filled 2022-09-09: qty 1

## 2022-09-09 MED ORDER — NIFEDIPINE ER 30 MG PO TB24: 30.0000 mg | ORAL_TABLET | Freq: Every day | ORAL | 2 refills | Status: AC

## 2022-09-09 MED ORDER — CALCIUM GLUCONATE 10 % IV SOLN
INTRAVENOUS | Status: AC
  Filled 2022-09-09: qty 10

## 2022-09-09 MED ORDER — LACTATED RINGERS IV BOLUS
1000.0000 mL | Freq: Once | INTRAVENOUS | Status: AC
  Administered 2022-09-09: 1000 mL via INTRAVENOUS

## 2022-09-09 NOTE — OB Triage Note (Signed)
Pt being discharged by Donato Schultz CNM w new rx for Procardia. Education given, discharge instructions explained. Pt. Verbalized understanding.

## 2022-09-09 NOTE — OB Triage Note (Signed)
G2P2 36 y.o patient presents to triage 19 days pp with a headache and elevated bp. Pt currently reports her pain as a 2/10 and denies other symptoms of preeclampsia. She presented to the ED but hasn't been given any medications. Donato Schultz CNM is currently monitoring her BP. Plan includes IV fluids and magnesium if BP's are elevated.

## 2022-09-09 NOTE — Discharge Summary (Signed)
Colleen Navarro is a 36 y.o. female. She is a G2P2002 at 19 days postpartum, repeat cesarean section 08/21/2022  Prenatal care site: [facility]  Previous pregnancy complicated by:  Morbid obesity, BMI 41.4, weight gain approx 30lbs Tobacco use, quit approx 05/2021; currently vapes.  Psoriatic arthritis- stopped meds (Taltz) during pregnancy around 23wks Prior LTCS for NRFHR during IOL, desires VBAC Hx wound seromas, delayed CS healing for 71yr.  Hx lumbar discectomy, seen by [facility] anesthesia 07/01/22 Late PNC at 23wks Rubella NON-immune Mild anemia, on iron supplements  Suspected hypospadias, micropenis or ambiguous genitalia, scrotum seen on anatomy US with MFM, female fetus by NIPT    Chief complaint: Multiple Bps >160/110 with severe headache  She had called the on-call number earlier in the day to report multiple severe-range blood pressures and a painful headache that was not resolving with Tylenol, fluid, or rest. She came to the ER for evaluation and BP in the ER was 167/73 and she had a continued headache.  S: Resting comfortably. Denies: HA, visual changes, SOB, or RUQ/epigastric pain  Maternal Medical History:   Past Medical History:  Diagnosis Date   Anemia    hx of   Chronic back pain    low back   GERD (gastroesophageal reflux disease)    Headache(784.0)    occasional and hx of migraines   Psoriasis    Urinary tract infection    hx of    Past Surgical History:  Procedure Laterality Date   CESAREAN SECTION  08/21/2022   Procedure: CESAREAN SECTION;  Surgeon: Conard Novak, MD;  Location: [facility] ORS;  Service: Obstetrics;;   CHOLECYSTECTOMY     COSMETIC SURGERY     right cheek (face)   LUMBAR LAMINECTOMY/DECOMPRESSION MICRODISCECTOMY Bilateral 12/04/2012   Procedure: LUMBAR LAMINECTOMY/DECOMPRESSION MICRODISCECTOMY 1 LEVEL;  Surgeon: Carmela Hurt, MD;  Location: [facility];  Service: Neurosurgery;  Laterality: Bilateral;  Bilateral Lumbar four-five   diskectomy    Allergies  Allergen Reactions   Bactrim [Sulfamethoxazole-Trimethoprim] Shortness Of Breath   Iodine Hives   Neosporin [Neomycin-Bacitracin Zn-Polymyx] Rash    No trouble breating    Prior to Admission medications   Medication Sig Start Date End Date Taking? Authorizing Provider  acetaminophen (TYLENOL) 500 MG tablet Take 2 tablets (1,000 mg total) by mouth every 6 (six) hours. 08/22/22  Yes McVey, Prudencio Pair, CNM  Cholecalciferol (VITAMIN D3) 1.25 MG (50000 UT) CAPS Take 1 capsule by mouth daily.   Yes [provider]  enoxaparin (LOVENOX) 40 MG/0.4ML injection Inject 0.4 mLs (40 mg total) into the skin daily for 28 days. 08/22/22 09/19/22 Yes McVey, Prudencio Pair, CNM  ferrous sulfate 325 (65 FE) MG tablet Take 1 tablet (325 mg total) by mouth 2 (two) times daily with a meal. 08/22/22  Yes McVey, Lurena Joiner A, CNM  ibuprofen (ADVIL) 600 MG tablet Take 1 tablet (600 mg total) by mouth every 6 (six) hours. 08/22/22  Yes McVey, Prudencio Pair, CNM  Magnesium 500 MG TABS Take 1 tablet by mouth daily.   Yes [provider]  Prenatal Vit-Fe Fumarate-FA (PRENATAL MULTIVITAMIN) TABS tablet Take 1 tablet by mouth daily at 12 noon. 08/22/22  Yes McVey, Prudencio Pair, CNM  senna-docusate (SENOKOT-S) 8.6-50 MG tablet Take 2 tablets by mouth daily. 08/23/22  Yes McVey, Prudencio Pair, CNM  simethicone (MYLICON) 80 MG chewable tablet Chew 1 tablet (80 mg total) by mouth as needed for flatulence. 08/22/22  Yes McVey, Prudencio Pair, CNM  coconut oil  OIL Apply 1 Application topically as needed. 08/22/22   McVey, Prudencio Pair, CNM  diphenhydrAMINE (BENADRYL) 25 mg capsule Take 1 capsule (25 mg total) by mouth every 6 (six) hours as needed for itching. Patient not taking: Reported on 09/08/2022 08/22/22   McVey, Prudencio Pair, CNM      Social History: She  reports that she has been smoking cigarettes. She has a 5.00 pack-year smoking history. She does not have any smokeless tobacco history on file. She reports  current alcohol use. She reports that she does not use drugs.  Family History: family history is not on file.  no history of gyn cancers  Review of Systems: A full review of systems was performed and negative except as noted in the HPI.     O:  BP 124/62 (BP Location: Right Arm)   Pulse 63   Temp 98.6 F (37 C) (Oral)   Resp 18   Ht 5\' 7"  (1.702 m)   Wt 124.7 kg   BMI 43.07 kg/m  Results for orders placed or performed during the hospital encounter of 09/08/22 (from the past 48 hour(s))  CBC with Differential   Collection Time: 09/08/22  7:57 PM  Result Value Ref Range   WBC 5.2 4.0 - 10.5 K/uL   RBC 4.21 3.87 - 5.11 MIL/uL   Hemoglobin 11.7 (L) 12.0 - 15.0 g/dL   HCT 09/10/22 62.8 - 31.5 %   MCV 87.9 80.0 - 100.0 fL   MCH 27.8 26.0 - 34.0 pg   MCHC 31.6 30.0 - 36.0 g/dL   RDW 17.6 16.0 - 73.7 %   Platelets 193 150 - 400 K/uL   nRBC 0.0 0.0 - 0.2 %   Neutrophils Relative % 58 %   Neutro Abs 3.1 1.7 - 7.7 K/uL   Lymphocytes Relative 28 %   Lymphs Abs 1.5 0.7 - 4.0 K/uL   Monocytes Relative 6 %   Monocytes Absolute 0.3 0.1 - 1.0 K/uL   Eosinophils Relative 5 %   Eosinophils Absolute 0.3 0.0 - 0.5 K/uL   Basophils Relative 1 %   Basophils Absolute 0.0 0.0 - 0.1 K/uL   Immature Granulocytes 2 %   Abs Immature Granulocytes 0.08 (H) 0.00 - 0.07 K/uL  Comprehensive metabolic panel   Collection Time: 09/08/22  7:57 PM  Result Value Ref Range   Sodium 141 135 - 145 mmol/L   Potassium 5.1 3.5 - 5.1 mmol/L   Chloride 108 98 - 111 mmol/L   CO2 27 22 - 32 mmol/L   Glucose, Bld 96 70 - 99 mg/dL   BUN 12 6 - 20 mg/dL   Creatinine, Ser 09/10/22 0.44 - 1.00 mg/dL   Calcium 9.2 8.9 - 2.69 mg/dL   Total Protein 7.6 6.5 - 8.1 g/dL   Albumin 3.9 3.5 - 5.0 g/dL   AST 40 15 - 41 U/L   ALT 21 0 - 44 U/L   Alkaline Phosphatase 66 38 - 126 U/L   Total Bilirubin 1.6 (H) 0.3 - 1.2 mg/dL   GFR, Estimated 48.5 >46 mL/min   Anion gap 6 5 - 15  Magnesium   Collection Time: 09/08/22  7:57 PM   Result Value Ref Range   Magnesium 2.2 1.7 - 2.4 mg/dL  Protein / creatinine ratio, urine   Collection Time: 09/08/22 10:55 PM  Result Value Ref Range   Creatinine, Urine 72 mg/dL   Total Protein, Urine <6 mg/dL   Protein Creatinine Ratio  0.00 - 0.15 mg/mg[Cre]  Urinalysis, Routine w reflex microscopic Urine, Clean Catch   Collection Time: 09/08/22 10:57 PM  Result Value Ref Range   Color, Urine YELLOW (A) YELLOW   APPearance HAZY (A) CLEAR   Specific Gravity, Urine 1.011 1.005 - 1.030   pH 5.0 5.0 - 8.0   Glucose, UA NEGATIVE NEGATIVE mg/dL   Hgb urine dipstick SMALL (A) NEGATIVE   Bilirubin Urine NEGATIVE NEGATIVE   Ketones, ur NEGATIVE NEGATIVE mg/dL   Protein, ur NEGATIVE NEGATIVE mg/dL   Nitrite NEGATIVE NEGATIVE   Leukocytes,Ua SMALL (A) NEGATIVE   RBC / HPF 0-5 0 - 5 RBC/hpf   WBC, UA 6-10 0 - 5 WBC/hpf   Bacteria, UA RARE (A) NONE SEEN   Squamous Epithelial / LPF 0-5 0 - 5   Mucus PRESENT      Constitutional: NAD, AAOx3  HE/ENT: extraocular movements grossly intact, moist mucous membranes CV: RRR PULM: nl respiratory effort, CTABL     Abd: non-tender, non-distended, soft , incision approximated and healing, no drainage noted     Ext: Non-tender, Nonedematous   Psych: mood appropriate, speech normal Pelvic: deferred  A/P: 36 y.o. G2P2002 at 19 days postpartum here for PIH work-up  Principle Diagnosis: Gestational hypertension, postpartum  Preeclampsia: not present. Labs WNL, BP improved, only one BP of >160/110 since coming to L&D. She denies any headache, changes of vision, or RUQ pain. No edema. DTRs +2, no clonus. Gestational HTN: BP 120s/60s-150/90s. Start Procardia 30mg  XL. Rx sent to pharmacy. Reviewed PIH warning signs Dr. aware that pre-eclampsia not present, so patient is being discharged Discharge home with normal postpartum precautions. Return for BP check in the office in 1 week   Feliberto Gottron, CNM 09/09/2022 3:14  AM

## 2022-09-09 NOTE — H&P (Addendum)
OB History & Physical   History of Present Illness:  Chief Complaint:   HPI:  Colleen Navarro is a 36 y.o. G18P2002 female at 52 days postpartum here for postpartum pre-eclampsia and headache.  [name] had a repeat cesarean section on 08/21/22.   She reports a headache today not relieved with Tylenol, rest, or hydration. She checked her BP at home and it was 170s/120s. She rested and rechecked her BP at home and it was still 160s/100s. Her headache has improved throughout the day and she now rates it a 1-2/10. She also reports visual disturbances earlier in the day but none currently. She denies RUQ/epigastric pain.  Tolerating regular diet, pain with PO meds, voiding and ambulating without difficulty.  No CP SOB Fever,Chills, N/V or leg pain; denies nipple or breast pain  Pregnancy Problems:  Morbid obesity, BMI 41.4, weight gain approx 30lbs Tobacco use, quit approx 05/2021; currently vapes.  Psoriatic arthritis- stopped meds (Taltz) during pregnancy around 23wks Prior LTCS for NRFHR during IOL, desires VBAC Hx wound seromas, delayed CS healing for 96yr.  Hx lumbar discectomy, seen by [facility] anesthesia 07/01/22 Late PNC at 23wks Rubella NON-immune Mild anemia, on iron supplements  Suspected hypospadias, micropenis or ambiguous genitalia, scrotum seen on anatomy US with MFM, female fetus by NIPT    Maternal Medical History:   Past Medical History:  Diagnosis Date   Anemia    hx of   Chronic back pain    low back   GERD (gastroesophageal reflux disease)    Headache(784.0)    occasional and hx of migraines   Psoriasis    Urinary tract infection    hx of    Past Surgical History:  Procedure Laterality Date   CESAREAN SECTION  08/21/2022   Procedure: CESAREAN SECTION;  Surgeon: Conard Novak, MD;  Location: [facility] ORS;  Service: Obstetrics;;   CHOLECYSTECTOMY     COSMETIC SURGERY     right cheek (face)   LUMBAR LAMINECTOMY/DECOMPRESSION MICRODISCECTOMY Bilateral 12/04/2012    Procedure: LUMBAR LAMINECTOMY/DECOMPRESSION MICRODISCECTOMY 1 LEVEL;  Surgeon: Carmela Hurt, MD;  Location: [facility];  Service: Neurosurgery;  Laterality: Bilateral;  Bilateral Lumbar four-five  diskectomy    Allergies  Allergen Reactions   Bactrim [Sulfamethoxazole-Trimethoprim] Shortness Of Breath   Iodine Hives   Neosporin [Neomycin-Bacitracin Zn-Polymyx] Rash    No trouble breating    Prior to Admission medications   Medication Sig Start Date End Date Taking? Authorizing Provider  acetaminophen (TYLENOL) 500 MG tablet Take 2 tablets (1,000 mg total) by mouth every 6 (six) hours. 08/22/22  Yes McVey, Prudencio Pair, CNM  Cholecalciferol (VITAMIN D3) 1.25 MG (50000 UT) CAPS Take 1 capsule by mouth daily.   Yes [provider]  enoxaparin (LOVENOX) 40 MG/0.4ML injection Inject 0.4 mLs (40 mg total) into the skin daily for 28 days. 08/22/22 09/19/22 Yes McVey, Prudencio Pair, CNM  ferrous sulfate 325 (65 FE) MG tablet Take 1 tablet (325 mg total) by mouth 2 (two) times daily with a meal. 08/22/22  Yes McVey, Lurena Joiner A, CNM  ibuprofen (ADVIL) 600 MG tablet Take 1 tablet (600 mg total) by mouth every 6 (six) hours. 08/22/22  Yes McVey, Prudencio Pair, CNM  Magnesium 500 MG TABS Take 1 tablet by mouth daily.   Yes [provider]  Prenatal Vit-Fe Fumarate-FA (PRENATAL MULTIVITAMIN) TABS tablet Take 1 tablet by mouth daily at 12 noon. 08/22/22  Yes McVey, Prudencio Pair, CNM  senna-docusate (SENOKOT-S) 8.6-50 MG tablet Take 2 tablets  by mouth daily. 08/23/22  Yes McVey, Murray Hodgkins, CNM  simethicone (MYLICON) 80 MG chewable tablet Chew 1 tablet (80 mg total) by mouth as needed for flatulence. 08/22/22  Yes McVey, Murray Hodgkins, CNM  coconut oil OIL Apply 1 Application topically as needed. 08/22/22   McVey, Murray Hodgkins, CNM  diphenhydrAMINE (BENADRYL) 25 mg capsule Take 1 capsule (25 mg total) by mouth every 6 (six) hours as needed for itching. Patient not taking: Reported on 09/08/2022 08/22/22   McVey,  Murray Hodgkins, CNM     Prenatal/Postpartum care site: [place] History: She  reports that she has been smoking cigarettes. She has a 5.00 pack-year smoking history. She does not have any smokeless tobacco history on file. She reports current alcohol use. She reports that she does not use drugs.  Family History: family history is not on file.   Review of Systems: A full review of systems was performed and negative except as noted in the HPI.     Physical Exam:  Vital Signs: BP 139/73   Pulse (!) 56   Temp 98.6 F (37 C) (Oral)   Resp 16   Ht 5\' 7"  (1.702 m)   Wt 124.7 kg   BMI 43.07 kg/m  General: no acute distress.  HEENT: normocephalic, atraumatic Heart: regular rate & rhythm.  No murmurs/rubs/gallops Lungs: clear to auscultation bilaterally, normal respiratory effort Abdomen: soft, gravid, non-tender Incision: approximated, clean, dry, no redness or drainage Extremities: non-tender, symmetric, mild edema bilaterally.  DTRs: +2, no clonus present  Neurologic: Alert & oriented x 3.    Results for orders placed or performed during the hospital encounter of 09/08/22 (from the past 24 hour(s))  CBC with Differential     Status: Abnormal   Collection Time: 09/08/22  7:57 PM  Result Value Ref Range   WBC 5.2 4.0 - 10.5 K/uL   RBC 4.21 3.87 - 5.11 MIL/uL   Hemoglobin 11.7 (L) 12.0 - 15.0 g/dL   HCT 37.0 36.0 - 46.0 %   MCV 87.9 80.0 - 100.0 fL   MCH 27.8 26.0 - 34.0 pg   MCHC 31.6 30.0 - 36.0 g/dL   RDW 13.0 11.5 - 15.5 %   Platelets 193 150 - 400 K/uL   nRBC 0.0 0.0 - 0.2 %   Neutrophils Relative % 58 %   Neutro Abs 3.1 1.7 - 7.7 K/uL   Lymphocytes Relative 28 %   Lymphs Abs 1.5 0.7 - 4.0 K/uL   Monocytes Relative 6 %   Monocytes Absolute 0.3 0.1 - 1.0 K/uL   Eosinophils Relative 5 %   Eosinophils Absolute 0.3 0.0 - 0.5 K/uL   Basophils Relative 1 %   Basophils Absolute 0.0 0.0 - 0.1 K/uL   Immature Granulocytes 2 %   Abs Immature Granulocytes  0.08 (H) 0.00 - 0.07 K/uL  Comprehensive metabolic panel     Status: Abnormal   Collection Time: 09/08/22  7:57 PM  Result Value Ref Range   Sodium 141 135 - 145 mmol/L   Potassium 5.1 3.5 - 5.1 mmol/L   Chloride 108 98 - 111 mmol/L   CO2 27 22 - 32 mmol/L   Glucose, Bld 96 70 - 99 mg/dL   BUN 12 6 - 20 mg/dL   Creatinine, Ser 0.95 0.44 - 1.00 mg/dL   Calcium 9.2 8.9 - 10.3 mg/dL   Total Protein 7.6 6.5 - 8.1 g/dL   Albumin 3.9 3.5 - 5.0 g/dL   AST 40  15 - 41 U/L   ALT 21 0 - 44 U/L   Alkaline Phosphatase 66 38 - 126 U/L   Total Bilirubin 1.6 (H) 0.3 - 1.2 mg/dL   GFR, Estimated >60 >60 mL/min   Anion gap 6 5 - 15  Magnesium     Status: None   Collection Time: 09/08/22  7:57 PM  Result Value Ref Range   Magnesium 2.2 1.7 - 2.4 mg/dL  Protein / creatinine ratio, urine     Status: None   Collection Time: 09/08/22 10:55 PM  Result Value Ref Range   Creatinine, Urine 72 mg/dL   Total Protein, Urine <6 mg/dL   Protein Creatinine Ratio        0.00 - 0.15 mg/mg[Cre]  Urinalysis, Routine w reflex microscopic Urine, Clean Catch     Status: Abnormal   Collection Time: 09/08/22 10:57 PM  Result Value Ref Range   Color, Urine YELLOW (A) YELLOW   APPearance HAZY (A) CLEAR   Specific Gravity, Urine 1.011 1.005 - 1.030   pH 5.0 5.0 - 8.0   Glucose, UA NEGATIVE NEGATIVE mg/dL   Hgb urine dipstick SMALL (A) NEGATIVE   Bilirubin Urine NEGATIVE NEGATIVE   Ketones, ur NEGATIVE NEGATIVE mg/dL   Protein, ur NEGATIVE NEGATIVE mg/dL   Nitrite NEGATIVE NEGATIVE   Leukocytes,Ua SMALL (A) NEGATIVE   RBC / HPF 0-5 0 - 5 RBC/hpf   WBC, UA 6-10 0 - 5 WBC/hpf   Bacteria, UA RARE (A) NONE SEEN   Squamous Epithelial / LPF 0-5 0 - 5   Mucus PRESENT     Pertinent Results:  Prenatal Labs: Blood type/Rh O pos  Antibody screen neg  Rubella Non-Immune  Varicella Immune  RPR NR  HBsAg Neg  HIV NR  GC neg  Chlamydia neg  Genetic screening negative  1 hour GTT 126  3 hour GTT   GBS Negative     Assessment:  Bryony PAISLY STACER is a 35 y.o. G1P2002 female at 28 days postpartum with pre-eclampsia with severe features.   Plan:  1. Admit to Labor & Delivery; consents reviewed and obtained - Dr. Ouida Sills aware of admission  2. Postpartum Hypertension - BP in the ER 167/73 - BP on L&D better, <160/110 - Labetalol protocol as needed for BP >160/110 - CBC, CMP, and P/C ratio normal - Tylenol/Ibuprofen for headache PRN - Notify CNM of severe-range blood pressures - check BP every 15 minutes. - Initiate IV magnesium if BP >160/110 x 2  3. Postpartum - Post-op day 19 - Pumping and providing breastmilk to infant. Infant is with FOB. Provide breast pump to use when needed. - Tylenol/Ibuprofen for incisional pain PRN  Gertie Fey, CNM 09/09/22 1:29 AM

## 2023-07-14 ENCOUNTER — Emergency Department
Admission: EM | Admit: 2023-07-14 | Discharge: 2023-07-14 | Disposition: A | Discharge status: Home / Self Care | Payer: Medicaid Other | Attending: Student in an Organized Health Care Education/Training Program | Admitting: Student in an Organized Health Care Education/Training Program

## 2023-07-14 ENCOUNTER — Other Ambulatory Visit: Payer: Self-pay

## 2023-07-14 ENCOUNTER — Encounter: Payer: Self-pay | Admitting: *Deleted

## 2023-07-14 DIAGNOSIS — W448XXA Other foreign body entering into or through a natural orifice, initial encounter: Secondary | ICD-10-CM | POA: Diagnosis not present

## 2023-07-14 DIAGNOSIS — T161XXA Foreign body in right ear, initial encounter: Secondary | ICD-10-CM | POA: Insufficient documentation

## 2023-07-14 MED ORDER — OFLOXACIN 0.3 % OP SOLN: 5.0000 [drp] | Freq: Two times a day (BID) | OPHTHALMIC | 0 refills | Status: AC

## 2023-07-14 NOTE — ED Triage Notes (Signed)
Pt reports qtip in right ear for unknown amount of time.   Pt was seen today at urgent care without success.  Pt has right ear pain.  Pt alert

## 2023-07-14 NOTE — ED Provider Notes (Signed)
[facility] Provider Note    Event Date/Time   First MD Initiated Contact with Patient 07/14/23 2102     (approximate)   History   Foreign Body in Ear   HPI  Colleen Navarro is a 37 y.o. female   who presents to the ER for evaluation of tip of a Q-tip embedded in the right ear.  Noted that she started having fullness in the right ear and difficulty hearing several days ago.  Went to urgent care and they attempted to irrigate as well as manually remove the foreign body but they were unsuccessful.  Did have some bleeding.  Denies any significant pain or discomfort at this time.  There was some related concern that it may be adhered to the TM.       Physical Exam   Triage Vital Signs: ED Triage Vitals  Encounter Vitals Group     BP 07/14/23 1948 128/79     Systolic BP Percentile --      Diastolic BP Percentile --      Pulse Rate 07/14/23 1948 60     Resp 07/14/23 1948 18     Temp 07/14/23 1948 99.2 F (37.3 C)     Temp Source 07/14/23 1948 Oral     SpO2 07/14/23 1948 100 %     Weight 07/14/23 1945 240 lb (108.9 kg)     Height 07/14/23 1945 5\' 7"  (1.702 m)     Head Circumference --      Peak Flow --      Pain Score 07/14/23 1945 1     Pain Loc --      Pain Education --      Exclude from Growth Chart --     Most recent vital signs: Vitals:   07/14/23 1948  BP: 128/79  Pulse: 60  Resp: 18  Temp: 99.2 F (37.3 C)  SpO2: 100%     Constitutional: Alert  Eyes: Conjunctivae are normal.  Head: Atraumatic. Nose: No congestion/rhinnorhea. Mouth/Throat: Mucous membranes are moist.   Right external auditory canal with cerumen as well as dried cotton tip adhered to TM.  Dried blood and external canal.  No mastoid tenderness.  Unable to fully  Neck: Painless ROM.  Cardiovascular:   Good peripheral circulation. Respiratory: Normal respiratory effort.  No retractions.  Gastrointestinal: Soft and nontender.  Musculoskeletal:  no  deformity Neurologic:  MAE spontaneously. No gross focal neurologic deficits are appreciated.  Skin:  Skin is warm, dry and intact. No rash noted. Psychiatric: Mood and affect are normal. Speech and behavior are normal.    ED Results / Procedures / Treatments   Labs (all labs ordered are listed, but only abnormal results are displayed) Labs Reviewed - No data to display    Procedures   MEDICATIONS ORDERED IN ED: Medications - No data to display   IMPRESSION / MDM / ASSESSMENT AND PLAN / ED COURSE  I reviewed the triage vital signs and the nursing notes.                              Differential diagnosis includes, but is not limited to, foreign body, ruptured TM,, external otitis.  Patient presented to the ER for evaluation of symptoms as described above.  Will be given referral to the ENT.  Will be placed on topical drops given concern for mild otitis externa.  Discussed supportive care and signs and symptoms  for which she should return to the ER.       FINAL CLINICAL IMPRESSION(S) / ED DIAGNOSES   Final diagnoses:  Foreign body of right ear, initial encounter     Rx / DC Orders   ED Discharge Orders          Ordered    ofloxacin (OCUFLOX) 0.3 % ophthalmic solution  2 times daily        07/14/23 2139             Note:  This document was prepared using Dragon voice recognition software and may include unintentional dictation errors.    Willy Eddy, MD 07/14/23 2140

## 2024-06-04 ENCOUNTER — Other Ambulatory Visit: Payer: Self-pay | Admitting: Medical Genetics

## 2024-07-30 ENCOUNTER — Other Ambulatory Visit: Payer: Self-pay | Admitting: Medical Genetics

## 2024-08-29 ENCOUNTER — Other Ambulatory Visit: Payer: Self-pay | Admitting: Medical Genetics

## 2024-08-29 DIAGNOSIS — Z006 Encounter for examination for normal comparison and control in clinical research program: Secondary | ICD-10-CM

## 2024-09-21 LAB — GENECONNECT MOLECULAR SCREEN: Genetic Analysis Overall Interpretation: NEGATIVE
